# Patient Record
Sex: Female | Born: 1950 | Race: White | Hispanic: No | Marital: Married | Smoking: Never smoker
Health system: Southern US, Community
[De-identification: ages and names within clinical notes are randomized; demographics above are authoritative.]

## PROBLEM LIST (undated history)

## (undated) DIAGNOSIS — H539 Unspecified visual disturbance: Secondary | ICD-10-CM

## (undated) DIAGNOSIS — R519 Headache, unspecified: Secondary | ICD-10-CM

## (undated) DIAGNOSIS — E119 Type 2 diabetes mellitus without complications: Secondary | ICD-10-CM

## (undated) DIAGNOSIS — R51 Headache: Secondary | ICD-10-CM

## (undated) DIAGNOSIS — I10 Essential (primary) hypertension: Secondary | ICD-10-CM

## (undated) HISTORY — PX: VESICOVAGINAL FISTULA CLOSURE W/ TAH: SUR271

## (undated) HISTORY — DX: Essential (primary) hypertension: I10

## (undated) HISTORY — DX: Type 2 diabetes mellitus without complications: E11.9

## (undated) HISTORY — DX: Headache: R51

## (undated) HISTORY — DX: Unspecified visual disturbance: H53.9

## (undated) HISTORY — DX: Headache, unspecified: R51.9

---

## 2015-02-28 ENCOUNTER — Encounter: Payer: Self-pay | Admitting: Neurology

## 2015-02-28 ENCOUNTER — Ambulatory Visit (INDEPENDENT_AMBULATORY_CARE_PROVIDER_SITE_OTHER): Payer: Medicare Other | Admitting: Neurology

## 2015-02-28 VITALS — BP 130/84 | HR 68 | Resp 16 | Ht 62.0 in | Wt 211.6 lb

## 2015-02-28 DIAGNOSIS — G4733 Obstructive sleep apnea (adult) (pediatric): Secondary | ICD-10-CM | POA: Diagnosis not present

## 2015-02-28 DIAGNOSIS — R413 Other amnesia: Secondary | ICD-10-CM

## 2015-02-28 DIAGNOSIS — R2689 Other abnormalities of gait and mobility: Secondary | ICD-10-CM | POA: Diagnosis not present

## 2015-02-28 DIAGNOSIS — M549 Dorsalgia, unspecified: Secondary | ICD-10-CM | POA: Diagnosis not present

## 2015-02-28 DIAGNOSIS — G8929 Other chronic pain: Secondary | ICD-10-CM | POA: Diagnosis not present

## 2015-02-28 DIAGNOSIS — M542 Cervicalgia: Secondary | ICD-10-CM | POA: Diagnosis not present

## 2015-02-28 MED ORDER — DIAZEPAM 10 MG PO TABS: 10.0000 mg | ORAL_TABLET | Freq: Every evening | ORAL | Status: DC | PRN

## 2015-02-28 MED ORDER — LYRICA 200 MG PO CAPS: 200.0000 mg | ORAL_CAPSULE | Freq: Two times a day (BID) | ORAL | Status: DC

## 2015-02-28 NOTE — Progress Notes (Signed)
[facility]  PATIENT: Stacey Jennings DOB: 06-Jun-1951  REFERRING DOCTOR OR PCP:  Forrest Moron SOURCE: Patient and records form [facility]  _________________________________   HISTORICAL  CHIEF COMPLAINT:  Chief Complaint  Patient presents with  . Back Pain    RAS pt. from [facility].  Here for f/u of lbp, right leg numbness.  Hx. of 2 lumbar surgeries by Dr. Nadene Rubins.  Sts. she is currently taking Lyrica and Nucynta--sts. Lyrica doesn't help as much as it used to. Also has hx. of neck pain with one surgery (by Laural Benes NS)--but sts. neck does not bother her./fim    HISTORY OF PRESENT ILLNESS:  Stacey Jennings is a 64 year old woman who I have followed at [facility] for several years. She has multiple neurologic symptoms including poor balance, low back pain, sleep apnea and difficulty with her memory.   Poor balance:    Recently, she feels balance is worse and walking is more difficult.  She feels she tips towards the right and sometimes hits walls or door frames.   She hasn't fallen recently but has a couple falls last year.   Vitamin B12 and copper were normal in December 2015.  Neck pain:   She has some discomfort in her neck but feels that it is stable and tolerable at this point.   MRI of the cervical spine dated 10/04/2014 shows prior fusion at C6-C7 with some posterior bony prominence and left uncovertebral spurring causing mild left foraminal narrowing but no nerve root impingement at this level. A small focus of hyperintensity within the spinal cord adjacent to this level.  At C5-C6, there is mild spinal stenosis due to broad disc protrusion and facet hypertrophy there is mild foraminal narrowing. There is no myelopathic spinal cord changes. Milder degenerative changes are noted at C2-C3 and C4-C5 without impingement of the neural structures.   Low back pain:    MRI dated 02/25/2012 shows prior L4-S1 fusion and a central disc herniation  at L5-S1 displacing both S1 nerve roots, right greater than left with some edema noted in the right S1 nerve root. There is prior surgical fusion at L4-S1. There is severe bilateral facet hypertrophy at L2-L3 and L3-L4.  She had LBP surgery in the past.    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    She has right > left leg pain.   She gets some benefit from Nucynta and Lyrica but the benefit seems less than in the past.   She takes up to 4 Nucynta's a day.   She is on Lyrica 200 mg bid.  She was once on a higher dose but it was lowered when she had confusion in the hospital.   She has had a couple ESI's but the last 2 have not helped as much.    She would prefer not to have surgery again.   Climbing stairs increases her pain.  Legs hurt more when they dangle (like on a stool).   She notes numbness in the right outer foot.      OSA: I reviewed the PSG 01/25/2014 and subsequent titration study.   They show severe OSA with AHI equals 82.3. She was titrated to CPAP +13.  She is compliant but noted no improvement after starting.     Memory:   She remains very forgetful.  She also occasionally 'talks out of her head'.    She tarts talking about a subject that does not make sense  and talk is not in a logical sequence.    She had an MRI years ago but I don't heave records.    She has had TIA in the past and is on Plavix.  She feels boot is doing well and she denies any significant depression or anxiety.  REVIEW OF SYSTEMS: Constitutional: No fevers, chills, sweats, or change in appetite.  Notes fatigue Eyes: No visual changes, double vision, eye pain Ear, nose and throat: No hearing loss, ear pain, nasal congestion, sore throat Cardiovascular: No chest pain, palpitations Respiratory: No shortness of breath at rest or with exertion.   No wheezes GastrointestinaI: No nausea, vomiting, diarrhea, abdominal pain, fecal incontinence Genitourinary: No dysuria, urinary retention or  frequency.  No nocturia. Musculoskeletal: as above Integumentary: No rash, pruritus, skin lesions Neurological: as above Psychiatric: No current depression at this time.  No anxiety Endocrine: No palpitations, diaphoresis, change in appetite, change in weight or increased thirst Hematologic/Lymphatic: No anemia, purpura, petechiae. Allergic/Immunologic: No itchy/runny eyes, nasal congestion, recent allergic reactions, rashes  ALLERGIES: Allergies  Allergen Reactions  . Codeine Other (See Comments)    hypersomnia  . Fentanyl Other (See Comments)    Hypersomnia  . Haldol [Haloperidol Lactate] Other (See Comments)    hallucinations    HOME MEDICATIONS:  Current outpatient prescriptions:  .  amLODipine (NORVASC) 5 MG tablet, Take 5 mg by mouth daily., Disp: , Rfl:  .  clopidogrel (PLAVIX) 75 MG tablet, Take 75 mg by mouth daily., Disp: , Rfl:  .  diazepam (VALIUM) 10 MG tablet, Take 10 mg by mouth at bedtime as needed., Disp: , Rfl:  .  enalapril (VASOTEC) 20 MG tablet, Take 20 mg by mouth 2 (two) times daily., Disp: , Rfl:  .  famotidine (PEPCID) 40 MG tablet, Take 40 mg by mouth daily., Disp: , Rfl:  .  levothyroxine (SYNTHROID, LEVOTHROID) 125 MCG tablet, daily., Disp: , Rfl:  .  LYRICA 200 MG capsule, Take 200 mg by mouth 2 (two) times daily., Disp: , Rfl:  .  metoprolol (TOPROL-XL) 200 MG 24 hr tablet, Take 200 mg by mouth daily., Disp: , Rfl:  .  NUCYNTA 75 MG TABS, Take 75 mg by mouth every 6 (six) hours as needed., Disp: , Rfl:  .  simvastatin (ZOCOR) 10 MG tablet, Take 10 mg by mouth daily., Disp: , Rfl:  .  VICTOZA 18 MG/3ML SOPN, , Disp: , Rfl:   PAST MEDICAL HISTORY: Past Medical History  Diagnosis Date  . Diabetes mellitus without complication   . Headache   . Hypertension   . Vision abnormalities     PAST SURGICAL HISTORY: Past Surgical History  Procedure Laterality Date  . Vesicovaginal fistula closure w/ tah      FAMILY HISTORY: Family History    Problem Relation Age of Onset  . Breast cancer Mother   . Hypertension Mother   . Huntington's disease Father   . Huntington's disease Sister     SOCIAL HISTORY:  History   Social History  . Marital Status: Married    Spouse Name: N/A  . Number of Children: N/A  . Years of Education: N/A   Occupational History  . Not on file.   Social History Main Topics  . Smoking status: Never Smoker   . Smokeless tobacco: Not on file  . Alcohol Use: No  . Drug Use: No  . Sexual Activity: Not on file   Other Topics Concern  . Not on file   Social History  Narrative  . No narrative on file     PHYSICAL EXAM  Filed Vitals:   02/28/15 0841  BP: 130/84  Pulse: 68  Resp: 16  Height:  (1.575 m)  Weight: 211 lb 9.6 oz (95.981 kg)    Body mass index is 38.69 kg/(m^2).   General: The patient is well-developed and well-nourished and in no acute distress  Eyes:  Nonicteric, nonerythematous.  Neck: The neck is supple, no carotid bruits are noted.  The neck is nontender with slight reduced ROM  Cardiovascular: The heart has a regular rate and rhythm with a normal S1 and S2. There were no murmurs, gallops or rubs. Lungs are clear to auscultation.  Skin: Extremities are without significant edema.  Musculoskeletal:  Back shows surgical scar well healed.   Mild paraspinal tenderness  Neurologic Exam  Mental status: The patient is alert and oriented x 3 at the time of the examination. The patient has reduced recent memory (1/3 without prompt, 2/3 category prompt and 3/3 list prompt), with a reduced attention span and concentration ability (100-93-?; WORLD-DLROW).   Clock numbers are well spaced, minute hand is off.   Speech is normal.  Cranial nerves: Extraocular movements are full. Pupils are equal, round, and reactive to light and accomodation.  Visual fields are full.  Facial symmetry is present. There is good facial sensation to soft touch bilaterally.Facial strength is  normal.  Trapezius and sternocleidomastoid strength is normal. No dysarthria is noted.  The tongue is midline, and the patient has symmetric elevation of the soft palate. No obvious hearing deficits are noted.  Motor:  Muscle bulk is normal.   Tone is normal. Strength is  5 / 5 in all 4 extremities.   Sensory: Sensory testing is intact to pinprick, soft touch and vibration sensation in arms and reduced right S1 sensation in legs.  Coordination: Cerebellar testing reveals good finger-nose-finger and heel-to-shin bilaterally.  Gait and station: Station is normal.   Gait is wide stanced with 3 step 180 turn. Tandem gait is poor. Romberg is positive.   Reflexes: Deep tendon reflexes are symmetric and normal bilaterally in arms and absent in both legs.   Plantar responses are flexor.    DIAGNOSTIC DATA (LABS, IMAGING, TESTING) - I reviewed patient records, labs, notes, testing and imaging myself where available.    ASSESSMENT AND PLAN  Poor balance - Plan: MR Brain W Wo Contrast  Neck pain  Chronic back pain  Obstructive sleep apnea  Memory loss - Plan: MR Brain W Wo Contrast   In summary, Stacey Jennings is a 64 year old woman who has noted more difficulty with her balance and memory over the past year. At her last visit with me, I checked vitamin B12 and copper and they were fine. 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 I will check an MRI of the brain to rule out SDH, NPH, strokes and mass lesions. She should continue her CPAP for OSA. Unfortunately, starting CPAP did not help her cognition.   Her other issues are related to pain in the neck and the back and legs.    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   I will refill her medications and she is advised to try to stay active.  She will return to see me in 3 months or  sooner if she has  new or worsening neurologic symptoms.  50 minute face-to-face interaction with greater than 50% of the time counseling or coordinating care about her worsening balance and memory.    Telisa Ohlsen A. Epimenio Foot, MD, PhD 02/28/2015, 9:04 AM Certified in Neurology, Clinical Neurophysiology, Sleep Medicine, Pain Medicine and Neuroimaging  [facility] [address] [place], [place] [address] [phone]

## 2015-03-14 ENCOUNTER — Ambulatory Visit
Admission: RE | Admit: 2015-03-14 | Discharge: 2015-03-14 | Disposition: A | Discharge status: Home / Self Care | Payer: Medicare Other | Source: Ambulatory Visit | Attending: Neurology | Admitting: Neurology

## 2015-03-14 DIAGNOSIS — R2689 Other abnormalities of gait and mobility: Secondary | ICD-10-CM

## 2015-03-14 DIAGNOSIS — R413 Other amnesia: Secondary | ICD-10-CM

## 2015-03-14 MED ORDER — GADOBENATE DIMEGLUMINE 529 MG/ML IV SOLN
20.0000 mL | Freq: Once | INTRAVENOUS | Status: AC | PRN
  Administered 2015-03-14: 20 mL via INTRAVENOUS

## 2015-03-20 ENCOUNTER — Telehealth: Payer: Self-pay | Admitting: *Deleted

## 2015-03-20 NOTE — Telephone Encounter (Signed)
Patient called/retuirning Faith's call from today. Please call and advise. Patient can be reached @ [phone]

## 2015-03-20 NOTE — Telephone Encounter (Signed)
-----   Message from Asa Lenteichard A Sater, MD sent at 03/20/2015  2:16 PM EDT ----- MRI shows old ischemic changes, noting looks recent...     I can compare with old MRI (I only have spine MR's form [facility], please request last Brain... [facility], [facility])

## 2015-03-20 NOTE — Telephone Encounter (Signed)
LMTC./fim 

## 2015-03-21 NOTE — Telephone Encounter (Signed)
-----   Message from Richard A Sater, MD sent at 03/20/2015  2:16 PM EDT ----- MRI shows old ischemic changes, noting looks recent...     I can compare with old MRI (I only have spine MR's form [facility], please request last Brain... If no recent one at [facility], please check [facility]) 

## 2015-03-21 NOTE — Telephone Encounter (Signed)
Patient returned call again. Please call and advise.

## 2015-03-21 NOTE — Telephone Encounter (Signed)
I have spoken with Stacey Jennings today and advised that per RAS, the mri of her brain showed old changes, but nothing that looks new.  I have checked with [facility] and was told that she has not had any mri brain there.  Stacey Jennings [facility].  She will pick up a copy on cd and drop it by the office./fim

## 2015-04-11 ENCOUNTER — Ambulatory Visit: Payer: Medicare Other | Admitting: Neurology

## 2015-04-19 ENCOUNTER — Encounter: Payer: Self-pay | Admitting: Neurology

## 2015-05-23 ENCOUNTER — Encounter: Payer: Self-pay | Admitting: Neurology

## 2015-05-23 ENCOUNTER — Ambulatory Visit (INDEPENDENT_AMBULATORY_CARE_PROVIDER_SITE_OTHER): Payer: Medicare Other | Admitting: Neurology

## 2015-05-23 VITALS — BP 156/72 | HR 68 | Resp 14 | Ht 62.0 in | Wt 192.0 lb

## 2015-05-23 DIAGNOSIS — G4733 Obstructive sleep apnea (adult) (pediatric): Secondary | ICD-10-CM

## 2015-05-23 DIAGNOSIS — I639 Cerebral infarction, unspecified: Secondary | ICD-10-CM | POA: Diagnosis not present

## 2015-05-23 DIAGNOSIS — M542 Cervicalgia: Secondary | ICD-10-CM | POA: Diagnosis not present

## 2015-05-23 DIAGNOSIS — Q211 Atrial septal defect, unspecified: Secondary | ICD-10-CM | POA: Insufficient documentation

## 2015-05-23 DIAGNOSIS — R2689 Other abnormalities of gait and mobility: Secondary | ICD-10-CM | POA: Diagnosis not present

## 2015-05-23 DIAGNOSIS — M549 Dorsalgia, unspecified: Secondary | ICD-10-CM | POA: Diagnosis not present

## 2015-05-23 DIAGNOSIS — R4189 Other symptoms and signs involving cognitive functions and awareness: Secondary | ICD-10-CM

## 2015-05-23 DIAGNOSIS — G8929 Other chronic pain: Secondary | ICD-10-CM | POA: Diagnosis not present

## 2015-05-23 NOTE — Progress Notes (Signed)
[facility]  PATIENT: Stacey Jennings DOB: 04/06/1951  REFERRING DOCTOR OR PCP:  Forrest MoronStephen Ruehle SOURCE: Patient and records form [facility]  _________________________________   HISTORICAL  CHIEF COMPLAINT:  Chief Complaint  Patient presents with  . Cerebrovascular Accident    Sts. in June she was in [place] with her family,  had sudden decline in cognition.  [facility]--husband sts. 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  She sts. she has an appt. with Dr. Bary CastillaKalil next week to discuss repairing ASD/fim    HISTORY OF PRESENT ILLNESS:  Stacey Jennings is a 64 year old woman who I have [facility] years. She has multiple neurologic symptoms including poor balance, low back pain, sleep apnea and difficulty with her memory.   While in [place] last month she had mental status changes and confusion.   Family thought she may have been dehydrated and they went to an Urgent Care.   The next day, she slept in late and when she tried to get out of bed, she slid off bed and was unable to get up for a couple hours.   She then became confused a little later and seemed to have some hallucinations.  She went to the hospital.   She was told she had a stroke a couple weeks earlier and they felt it may have been cardioembolic.   A TEE was done apparently showing an ASD.      Memory:   She ha shad been forgetful for the past couple years . She feels mood is doing well and she denies any significant depression or anxiety.   Family has noted that she has had some fluctuation is cognitive functioning, some days she seems mildy confused, although not the way she was last month.      Poor balance:    This is a little variable but no worse than last year.   She stumbles at times.   Vitamin B12 and copper were normal in December 2015.  OSA: She has severe OSA with AHI equals 82.3. She  was titrated to CPAP +13.  She is compliant.     She is urinating less at night but has not noted any other improvement.     Neck pain and back pain are doing better.   She notes benefit from Nucynta and Lyrica. MRI of the cervical spine dated 10/04/2014 shows prior fusion at C6-C7 with some posterior bony prominence and left uncovertebral spurring causing mild left foraminal narrowing but no nerve root impingement at this level. A small focus of hyperintensity within the spinal cord adjacent to this level.  At C5-C6, there is mild spinal stenosis due to broad disc protrusion and facet hypertrophy there is mild foraminal narrowing. There is no myelopathic spinal cord changes.   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 There is prior surgical fusion at L4-S1. There is severe bilateral facet hypertrophy at L2-L3 and L3-L4.     REVIEW OF SYSTEMS: Constitutional: No fevers, chills, sweats, or change in appetite.  Notes fatigue Eyes: No visual changes, double vision, eye pain Ear, nose and throat: No hearing loss, ear pain, nasal congestion, sore throat Cardiovascular: No chest pain, palpitations Respiratory: No shortness of breath at rest or with exertion.  No wheezes GastrointestinaI: No nausea, vomiting, diarrhea, abdominal pain, fecal incontinence Genitourinary: No dysuria, urinary retention or frequency.  No nocturia. Musculoskeletal: as above Integumentary: No rash, pruritus, skin lesions Neurological: as above Psychiatric: No current depression at this time.  No anxiety                                                                                                                                                                                                                                                                                                                                                                                                                                                         Endocrine: No palpitations, diaphoresis, change in appetite, change in weight or increased thirst Hematologic/Lymphatic: No anemia, purpura, petechiae. Allergic/Immunologic: No itchy/runny eyes, nasal congestion, recent allergic reactions, rashes  ALLERGIES: Allergies  Allergen Reactions  . Codeine Other (See Comments)    hypersomnia  . Fentanyl Other (See Comments)    Hypersomnia  . Haldol [Haloperidol Lactate] Other (See Comments)    hallucinations    HOME MEDICATIONS:  Current outpatient prescriptions:  .  amLODipine (NORVASC) 5 MG tablet, Take 5 mg by mouth daily., Disp: , Rfl:  .  clopidogrel (PLAVIX) 75 MG tablet, Take 75 mg by mouth daily., Disp: , Rfl:  .  diazepam (VALIUM) 10 MG tablet,  Take 1 tablet (10 mg total) by mouth at bedtime as needed., Disp: 90 tablet, Rfl: 1 .  enalapril (VASOTEC) 20 MG tablet, Take 20 mg by mouth 2 (two) times daily., Disp: , Rfl:  .  famotidine (PEPCID) 40 MG tablet, Take 40 mg by mouth daily., Disp: , Rfl:  .  levothyroxine (SYNTHROID, LEVOTHROID) 125 MCG tablet, daily., Disp: , Rfl:  .  LYRICA 200 MG capsule, Take 1 capsule (200 mg total) by mouth 2 (two) times daily., Disp: 180 capsule, Rfl: 1 .  metoprolol (TOPROL-XL) 200 MG 24 hr tablet, Take 200 mg by mouth daily., Disp: , Rfl:  .  NUCYNTA 75 MG TABS, Take 75 mg by mouth every 6 (six) hours as needed., Disp: , Rfl:  .  simvastatin (ZOCOR) 10 MG tablet, Take 10 mg by mouth daily., Disp: , Rfl:  .  VICTOZA 18 MG/3ML SOPN, , Disp: , Rfl:   PAST MEDICAL HISTORY: Past Medical History  Diagnosis Date  . Diabetes mellitus without complication   . Headache   . Hypertension   . Vision abnormalities     PAST SURGICAL HISTORY: Past Surgical History  Procedure Laterality Date  . Vesicovaginal fistula closure w/ tah      FAMILY  HISTORY: Family History  Problem Relation Age of Onset  . Breast cancer Mother   . Hypertension Mother   . Huntington's disease Father   . Huntington's disease Sister     SOCIAL HISTORY:  History   Social History  . Marital Status: Married    Spouse Name: N/A  . Number of Children: N/A  . Years of Education: N/A   Occupational History  . Not on file.   Social History Main Topics  . Smoking status: Never Smoker   . Smokeless tobacco: Not on file  . Alcohol Use: No  . Drug Use: No  . Sexual Activity: Not on file   Other Topics Concern  . Not on file   Social History Narrative     PHYSICAL EXAM  Filed Vitals:   05/23/15 1312  BP: 156/72  Pulse: 68  Resp: 14  Height: 5\' 2"  (1.575 m)  Weight: 192 lb (87.091 kg)    Body mass index is 35.11 kg/(m^2).   General: The patient is well-developed and well-nourished and in no acute distress  Eyes:  Nonicteric, nonerythematous.  Neck: The neck is supple, no carotid bruits are noted.  The neck is nontender with slight reduced ROM  Cardiovascular: The heart has a regular rate and rhythm with a normal S1 and S2. There were no murmurs, gallops or rubs. Lungs are clear to auscultation.  Skin: Extremities are without significant edema.  Musculoskeletal:  Back shows surgical scar well healed.   Mild paraspinal tenderness  Neurologic Exam  Mental status: The patient is alert and oriented x 3 at the time of the examination. The patient has reduced recent memory (1/3 without prompt, 2/3 category prompt and 3/3 list prompt), with a reduced attention span and concentration ability .       Speech is normal.  Cranial nerves: Extraocular movements are full. Pupils are equal, round, and reactive to light and accomodation.  Visual fields are full.  No neglect or extinction.   Facial symmetry is present. There is good facial sensation to soft touch bilaterally.Facial strength is normal.  Trapezius and sternocleidomastoid strength is  normal. No dysarthria is noted.  The tongue is midline, and the patient has symmetric elevation of the soft palate. No  obvious hearing deficits are noted.  Motor:  Muscle bulk is normal.   Tone is normal. Strength is  5 / 5 in all 4 extremities.   Sensory: Sensory testing is intact to pinprick, soft touch and vibration sensation in arms and reduced right S1 sensation in legs.  Coordination: Cerebellar testing reveals good finger-nose-finger and heel-to-shin bilaterally.  Gait and station: Station is normal.   Gait is mildly wide stanced  And she has a 3 step 180 turn. Tandem gait is poor. Romberg is positive.   Reflexes: Deep tendon reflexes are symmetric and normal bilaterally in arms and absent in both legs.     DIAGNOSTIC DATA (LABS, IMAGING, TESTING) - I reviewed patient records, labs, notes, testing and imaging myself where available.    ASSESSMENT AND PLAN  No diagnosis found. Cardioembolic stroke  ASD (atrial septal defect)  Obstructive sleep apnea  Poor balance  Disturbed cognition  Chronic back pain  Neck pain    1.   We will get records form [facility] in [place].    2.   Continue Plavix 3.   Long discussion about strokes and possible relationship to the ASD. It is important that I get her actual images from [place] to compare with her older MRIs. 4.   She will return to see me in 3 months or sooner if she has new or worsening neurologic symptoms.  45 minute face-to-face interaction with greater than one half of the time counseling and coordinating care about her recent stroke.   Serafino Burciaga A. Epimenio Foot, MD, PhD 05/23/2015, 1:16 PM Certified in Neurology, Clinical Neurophysiology, Sleep Medicine, Pain Medicine and Neuroimaging  [facility] [address] [place], [place] [address] [phone]

## 2015-05-30 DIAGNOSIS — I10 Essential (primary) hypertension: Secondary | ICD-10-CM | POA: Insufficient documentation

## 2015-05-30 DIAGNOSIS — E119 Type 2 diabetes mellitus without complications: Secondary | ICD-10-CM | POA: Insufficient documentation

## 2015-05-30 DIAGNOSIS — E785 Hyperlipidemia, unspecified: Secondary | ICD-10-CM | POA: Insufficient documentation

## 2015-05-30 DIAGNOSIS — E039 Hypothyroidism, unspecified: Secondary | ICD-10-CM | POA: Insufficient documentation

## 2015-06-06 DIAGNOSIS — Q211 Atrial septal defect: Secondary | ICD-10-CM | POA: Insufficient documentation

## 2015-06-06 DIAGNOSIS — Q2112 Patent foramen ovale: Secondary | ICD-10-CM | POA: Insufficient documentation

## 2015-06-06 DIAGNOSIS — G454 Transient global amnesia: Secondary | ICD-10-CM | POA: Insufficient documentation

## 2015-07-04 ENCOUNTER — Ambulatory Visit: Payer: Medicare Other | Admitting: Neurology

## 2015-07-04 ENCOUNTER — Encounter: Payer: Self-pay | Admitting: Neurology

## 2015-07-31 ENCOUNTER — Telehealth: Payer: Self-pay | Admitting: Neurology

## 2015-07-31 NOTE — Telephone Encounter (Signed)
Pt needs rx on NUCYNTA 75 MG TABS for a 90 day supply. May call pt at [phone]

## 2015-08-03 ENCOUNTER — Other Ambulatory Visit: Payer: Self-pay | Admitting: *Deleted

## 2015-08-03 MED ORDER — NUCYNTA 75 MG PO TABS: ORAL_TABLET | ORAL | Status: DC

## 2015-08-03 NOTE — Telephone Encounter (Signed)
Husband presented to the office for Nucynta r/f.  He apparently called on Monday, and message was taken by phone room, but never routed to anyone. Rx. printed, signed, up front [facility]/fim

## 2015-08-23 ENCOUNTER — Ambulatory Visit: Payer: Medicare Other | Admitting: Neurology

## 2015-08-29 ENCOUNTER — Ambulatory Visit (INDEPENDENT_AMBULATORY_CARE_PROVIDER_SITE_OTHER): Payer: Medicare Other | Admitting: Neurology

## 2015-08-29 ENCOUNTER — Encounter: Payer: Self-pay | Admitting: Neurology

## 2015-08-29 VITALS — BP 108/64 | HR 78 | Resp 14 | Ht 62.0 in | Wt 189.4 lb

## 2015-08-29 DIAGNOSIS — I639 Cerebral infarction, unspecified: Secondary | ICD-10-CM

## 2015-08-29 DIAGNOSIS — M546 Pain in thoracic spine: Secondary | ICD-10-CM | POA: Insufficient documentation

## 2015-08-29 DIAGNOSIS — G4733 Obstructive sleep apnea (adult) (pediatric): Secondary | ICD-10-CM | POA: Diagnosis not present

## 2015-08-29 DIAGNOSIS — M549 Dorsalgia, unspecified: Secondary | ICD-10-CM | POA: Diagnosis not present

## 2015-08-29 DIAGNOSIS — G8929 Other chronic pain: Secondary | ICD-10-CM

## 2015-08-29 DIAGNOSIS — Q211 Atrial septal defect, unspecified: Secondary | ICD-10-CM

## 2015-08-29 DIAGNOSIS — S22009A Unspecified fracture of unspecified thoracic vertebra, initial encounter for closed fracture: Secondary | ICD-10-CM | POA: Insufficient documentation

## 2015-08-29 DIAGNOSIS — S22000A Wedge compression fracture of unspecified thoracic vertebra, initial encounter for closed fracture: Secondary | ICD-10-CM | POA: Diagnosis not present

## 2015-08-29 MED ORDER — LYRICA 200 MG PO CAPS: 200.0000 mg | ORAL_CAPSULE | Freq: Two times a day (BID) | ORAL | Status: DC

## 2015-08-29 MED ORDER — NUCYNTA 75 MG PO TABS: ORAL_TABLET | ORAL | Status: DC

## 2015-08-29 NOTE — Progress Notes (Signed)
[facility]  PATIENT: Stacey Jennings DOB: May 12, 1951  REFERRING DOCTOR OR PCP:  Forrest Moron SOURCE: Patient and records form [facility]  _________________________________   HISTORICAL  CHIEF COMPLAINT:  Chief Complaint  Patient presents with  . 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 she has seen Dr. Bary Castilla to discuss ASD, tx. plan.  Sts. 2 wk. holter monitor was ok.  Sts. Dr. Bary Castilla rec. Aspirin 81mg  daily, which she is taking, along with Plavix, but that he did not strongly rec. fixing ASD at this time.  She sts. she will begin exercising as well (walking).  Sts. memory is about the same./fim  . Memory Loss  . Back Pain    Sts. has had right sided mid back pain since a fall in June.  Today she has cd images of x-rays of her right ribs and t-spine--sts. pcp ordered, told her she has a compression fx/fim    HISTORY OF PRESENT ILLNESS:  Stacey Jennings is a 64 year old woman wit more mid back pain, recent h/o cardio-embolic stroke, reduced memory, OSA and more chronic pain issues.     Mid back pain:   She has had more back pain since a fall in June.  Pain increased further last month.  She gets seconds of severe pain multiple times a day.  She has no pain when sitting and not moving.     Xray of T spine shows T7 fracture 08/09/15.  She has not had an MRI of the spine      CVA:   While in [place] last month she had mental status changes and confusion.   Family thought she may have been dehydrated and they went to an Urgent Care.   The next day, she slept in late and when she tried to get out of bed, she slid off bed and was unable to get up for a couple hours.   She then became confused a little later and seemed to have some hallucinations.  She went to the hospital.   She was told she had a stroke a couple weeks earlier and they felt it may have been cardioembolic.   A TEE was done apparently showing an ASD.      Memory:   She feels more  forgetful the past few years.   . She feels mood is doing well and she denies any significant depression or anxiety.   Family has noted that she has had some fluctuation is cognitive functioning, some days she seems mildy confused, although not the way she was last month.      Poor balance:   She notes gait is poor many days and better other days.    Vitamin B12 and copper were normal in December 2015.  OSA: She has severe OSA with AHI equals 82.3. She was titrated to CPAP +13.  She is compliant.     She is urinating less at night but has not noted any other improvement.     Neck pain and back pain are doing better.   She notes benefit from Nucynta 75 mg tid and Lyrica bid   Studies:   MRI of the cervical spine dated 10/04/2014 shows prior fusion at C6-C7 with some posterior bony prominence and left uncovertebral spurring causing mild left foraminal narrowing but no nerve root impingement at this level. A small focus of hyperintensity within the spinal cord adjacent to this level.  At C5-C6, there is mild spinal stenosis due to broad  disc protrusion and facet hypertrophy there is mild foraminal narrowing. There is no myelopathic spinal cord changes. 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 There is prior surgical fusion at L4-S1. There is severe bilateral facet hypertrophy at L2-L3 and L3-L4.     REVIEW OF SYSTEMS: Constitutional: No fevers, chills, sweats, or change in appetite.  Notes fatigue Eyes: No visual changes, double vision, eye pain Ear, nose and throat: No hearing loss, ear pain, nasal congestion, sore throat Cardiovascular: No chest pain, palpitations Respiratory: No shortness of breath at rest or with exertion.   No wheezes GastrointestinaI: No nausea, vomiting, diarrhea, abdominal pain, fecal incontinence Genitourinary: No  dysuria, urinary retention or frequency.  No nocturia. Musculoskeletal: as above Integumentary: No rash, pruritus, skin lesions Neurological: as above Psychiatric: No current depression at this time.  No anxiety  Endocrine: No palpitations, diaphoresis, change in appetite, change in weight or increased thirst Hematologic/Lymphatic: No anemia, purpura, petechiae. Allergic/Immunologic: No itchy/runny eyes, nasal congestion, recent allergic reactions, rashes  ALLERGIES: Allergies  Allergen Reactions  . Codeine Other (See Comments)    hypersomnia  . Fentanyl Other (See Comments)    Hypersomnia  . Haldol [Haloperidol Lactate] Other (See Comments)    hallucinations    HOME MEDICATIONS:  Current outpatient prescriptions:  .  amLODipine (NORVASC) 5 MG tablet, Take 5 mg by mouth daily., Disp: , Rfl:  .  aspirin EC 81 MG tablet, Take 81 mg by mouth daily., Disp: , Rfl:  .  clopidogrel (PLAVIX) 75 MG tablet, Take 75 mg by mouth daily., Disp: , Rfl:  .  diazepam (VALIUM) 10 MG tablet, Take 1 tablet (10 mg total) by mouth at bedtime as needed., Disp: 90 tablet, Rfl: 1 .  enalapril (VASOTEC) 20 MG tablet, Take 20 mg by mouth 2 (two) times daily., Disp: , Rfl:  .  famotidine (PEPCID) 40 MG tablet, Take 40 mg by mouth daily., Disp: , Rfl:  .  levothyroxine (SYNTHROID, LEVOTHROID) 125 MCG tablet, daily., Disp: , Rfl:  .  LYRICA 200 MG capsule, Take 1 capsule (200 mg total) by mouth 2 (two) times daily., Disp: 180 capsule, Rfl: 1 .  metFORMIN (GLUCOPHAGE) 500 MG tablet,  Take 500 mg by mouth daily., Disp: , Rfl:  .  metoprolol (TOPROL-XL) 200 MG 24 hr tablet, Take 200 mg by mouth daily., Disp: , Rfl:  .  NUCYNTA 75 MG TABS, Take one tablet 4 times daily as needed for pain.  Pharmacy may dispense one 30 day supply with 2 additional refills, or one 90 day supply., Disp: 360 tablet, Rfl: 0 .  simvastatin (ZOCOR) 10 MG tablet, Take 10 mg by mouth daily., Disp: , Rfl:  .  VICTOZA 18 MG/3ML SOPN, , Disp: , Rfl:   PAST MEDICAL HISTORY: Past Medical History  Diagnosis Date  . Diabetes mellitus without complication (HCC)   . Headache   . Hypertension   . Vision abnormalities     PAST SURGICAL HISTORY: Past Surgical History  Procedure Laterality Date  . Vesicovaginal fistula closure w/ tah      FAMILY HISTORY: Family History  Problem Relation Age of Onset  . Breast cancer Mother   . Hypertension Mother   . Huntington's disease Father   . Huntington's disease Sister     SOCIAL HISTORY:  Social History   Social History  . Marital Status: Married    Spouse Name: N/A  . Number of Children: N/A  . Years of Education: N/A   Occupational History  . Not on file.   Social History Main Topics  . Smoking status: Never Smoker   . Smokeless tobacco: Not on file  . Alcohol Use: No  . Drug Use: No  . Sexual Activity: Not on file   Other Topics Concern  . Not on file   Social History Narrative     PHYSICAL EXAM  Filed Vitals:   08/29/15 1419  BP: 108/64  Pulse: 78  Resp: 14  Height:  (1.575 m)  Weight: 189 lb 6.4 oz (85.911 kg)    Body mass index is 34.63 kg/(m^2).   General: The patient is well-developed and well-nourished and in no acute distress   Neurologic Exam  Mental status: The patient is alert and oriented x 3 at the time of the examination. The patient has reduced recent memory ( 2/3  and 3/3 list prompt), with a reduced attention span and concentration ability .     Speech is normal.  Cranial nerves: Extraocular  movements are full.  No neglect or extinction.   Facial symmetry is present. There is good facial sensation to soft touch bilaterally.Facial strength is normal.  Trapezius and sternocleidomastoid strength is normal. No dysarthria is noted. No obvious hearing deficits are noted.  Motor:  Muscle bulk is normal.   Tone is normal. Strength is  5 / 5 in all 4 extremities.   Sensory: Sensory testing is intact to pinprick, soft touch and vibration sensation in arms and reduced right S1 sensation in legs.  Coordination: Cerebellar testing reveals good finger-nose-finger and heel-to-shin bilaterally.  Gait and station: Station is normal.   Gait is mildly wide stanced with a 3 step 180 turn. Tandem gait is poor. Romberg is positive.   Reflexes: Deep tendon reflexes are symmetric and normal bilaterally in arms and absent in both legs.     DIAGNOSTIC DATA (LABS, IMAGING, TESTING) - I reviewed patient records, labs, notes, testing and imaging myself where available.    ASSESSMENT AND PLAN  Chronic back pain  Cardioembolic stroke (HCC)  ASD (atrial septal defect)  Obstructive sleep apnea Chronic back pain  Cardioembolic stroke (HCC)  ASD (atrial septal defect)  Obstructive sleep apnea    1.   D[facility] [place].    2.   Continue Plavix and ASA 3.   Renew Lyrica and Nucynta 4.    rtc 4 months or sooner if there are new or worsening neurologic symptoms. Richard A. Epimenio FootSater, MD, PhD 08/29/2015, 2:54 PM Certified in Neurology, Clinical Neurophysiology, Sleep Medicine, Pain Medicine and Neuroimaging  [facility] [address], [address] [place], [place] [id number] [phone]

## 2015-09-19 ENCOUNTER — Telehealth: Payer: Self-pay | Admitting: Neurology

## 2015-09-19 NOTE — Telephone Encounter (Signed)
Pt called and would like to know if she needs to come in to discuss her MRI results. She says that she can only come in on any Tuesday or Wednesdays, those are the only days she has a driver. Please call and advise [phone]

## 2015-09-20 NOTE — Telephone Encounter (Signed)
MRI of the thoracic spine shows that the mild thoracic fracture at T7 is healed at this point. Therefore there would be no benefit of doing vertebroplasty or other procedure.   There is no nerve root compression noted  If she is still having pain we can recommend physical therapy and that she continue her medications.

## 2015-09-20 NOTE — Telephone Encounter (Signed)
I have spoken with Stacey Jennings this morning and per RAS, advised that he has reviewed her mri t-spine, that fx. is completely healed, that she does not need to return to see him for this.  She verbalized understanding of same/fim

## 2015-11-15 ENCOUNTER — Other Ambulatory Visit: Payer: Self-pay | Admitting: Neurology

## 2015-11-15 ENCOUNTER — Encounter: Payer: Self-pay | Admitting: *Deleted

## 2015-11-15 MED ORDER — DIAZEPAM 10 MG PO TABS: 10.0000 mg | ORAL_TABLET | Freq: Every evening | ORAL | Status: DC | PRN

## 2015-11-15 NOTE — Progress Notes (Signed)
Valium rx. up front [facility]/fim 

## 2015-11-15 NOTE — Telephone Encounter (Signed)
Pt called requesting RX for diazepam (VALIUM) 10 MG tablet . Pt advised RX will ready within 24 hrs unless otherwise informed by RN

## 2015-12-06 DIAGNOSIS — Z9181 History of falling: Secondary | ICD-10-CM | POA: Insufficient documentation

## 2015-12-29 ENCOUNTER — Telehealth: Payer: Self-pay | Admitting: *Deleted

## 2016-01-01 NOTE — Telephone Encounter (Signed)
I have spoken with Stacey Jennings this morning and advised that I do not recall getting mri's from [place].  I have checked with RAS and he doesn't recall getting them either/fim

## 2016-01-02 ENCOUNTER — Ambulatory Visit (INDEPENDENT_AMBULATORY_CARE_PROVIDER_SITE_OTHER): Payer: Medicare Other | Admitting: Neurology

## 2016-01-02 ENCOUNTER — Encounter: Payer: Self-pay | Admitting: Neurology

## 2016-01-02 VITALS — BP 122/64 | HR 74 | Resp 18 | Ht 62.0 in | Wt 211.2 lb

## 2016-01-02 DIAGNOSIS — M549 Dorsalgia, unspecified: Secondary | ICD-10-CM

## 2016-01-02 DIAGNOSIS — I639 Cerebral infarction, unspecified: Secondary | ICD-10-CM

## 2016-01-02 DIAGNOSIS — Q211 Atrial septal defect: Secondary | ICD-10-CM | POA: Diagnosis not present

## 2016-01-02 DIAGNOSIS — S22000D Wedge compression fracture of unspecified thoracic vertebra, subsequent encounter for fracture with routine healing: Secondary | ICD-10-CM | POA: Diagnosis not present

## 2016-01-02 DIAGNOSIS — R0781 Pleurodynia: Secondary | ICD-10-CM

## 2016-01-02 DIAGNOSIS — G4733 Obstructive sleep apnea (adult) (pediatric): Secondary | ICD-10-CM

## 2016-01-02 DIAGNOSIS — R4189 Other symptoms and signs involving cognitive functions and awareness: Secondary | ICD-10-CM

## 2016-01-02 DIAGNOSIS — M542 Cervicalgia: Secondary | ICD-10-CM | POA: Diagnosis not present

## 2016-01-02 DIAGNOSIS — R0789 Other chest pain: Secondary | ICD-10-CM

## 2016-01-02 DIAGNOSIS — G8929 Other chronic pain: Secondary | ICD-10-CM | POA: Diagnosis not present

## 2016-01-02 DIAGNOSIS — Q2112 Patent foramen ovale: Secondary | ICD-10-CM

## 2016-01-02 MED ORDER — LYRICA 200 MG PO CAPS
200.0000 mg | ORAL_CAPSULE | Freq: Two times a day (BID) | ORAL | Status: DC
Start: 2016-01-02 — End: 2016-05-01

## 2016-01-02 MED ORDER — DULOXETINE HCL 60 MG PO CPEP: 60.0000 mg | ORAL_CAPSULE | Freq: Every day | ORAL | Status: DC

## 2016-01-02 MED ORDER — NUCYNTA 75 MG PO TABS: ORAL_TABLET | ORAL | Status: DC

## 2016-01-02 NOTE — Progress Notes (Signed)
[facility]  PATIENT: Stacey Jennings DOB: 1951/10/13  REFERRING DOCTOR OR PCP:  Forrest Moron SOURCE: Patient and records form [facility]  _________________________________   HISTORICAL  CHIEF COMPLAINT:  Chief Complaint  Patient presents with  . 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 she continues to take Plavix and ASA as rx'd.  Sts. she fell a couple of weeks ago--had been sitting on the toilet and when she got up, her leg was numb--she fell ? hitting right mid-upper back, right lateral rib area on the toilet./fim  . Back Pain    HISTORY OF PRESENT ILLNESS:  Stacey Jennings is a 65 year old woman with recent h/o cardio-embolic stroke, reduced memory, OSA and more chronic pain issues.       Recently, she had a fall when her left leg gave out upon standing.    She hit her right flank on the toilet and has had more mid/lower back pain.   She feels pain is similar to pain when she had a T7 fracture (around June 2016).    She notes gait is poor many days and better other days.    Vitamin B12 and copper were normal in December 2015.   Her legs often have a tingling dysesthesia.    CVA:   She has had no other stroke like symptoms.    Last  May, while in [place] last month she had mental status changes and confusion.   Family thought she may have been dehydrated and they went to an Urgent Care.   The next day, she slept in late and when she tried to get out of bed, she slid off bed and was unable to get up for a couple hours.   She then became confused a little later and seemed to have some hallucinations.  She went to the hospital.   She was told she had a stroke a couple weeks earlier and they felt it may have been cardioembolic.   A TEE was done apparently showing an ASD.      Memory/mood:   She feels forgetful the past few years, but feels unchanged today.  She takes longer to come up with the right thought.       She has had depression or  anxiety. She has not been on an antidepressant recently.     Family has noted that she has had some fluctuation is cognitive functioning.      OSA: She has severe OSA with AHI equals 82.3. She was titrated to CPAP +13.  She is compliant.     She is urinating less at night but has not noted any other improvement.     Neck pain and lower back pain are doing better.   She notes benefit from Nucynta 75 mg tid and Lyrica bid   Studies:   MRI of the cervical spine dated 10/04/2014 shows prior fusion at C6-C7 with some posterior bony prominence and left uncovertebral spurring causing mild left foraminal narrowing but no nerve root impingement at this level. A small focus of hyperintensity within the spinal cord adjacent to this level.  At C5-C6, there is mild spinal stenosis due to broad disc protrusion and facet hypertrophy there is mild foraminal narrowing. There is no myelopathic spinal cord changes. 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 There is  prior surgical fusion at L4-S1. There is severe bilateral facet hypertrophy at L2-L3 and L3-L4.   MRI of the brain 03/15/2015, before her stroke, showed 2 small lacunar infarctions in the left frontal lobe and some scattered chronic microvascular ischemic changes.    REVIEW OF SYSTEMS: Constitutional: No fevers, chills, sweats, or change in appetite.  Notes fatigue Eyes: No visual changes, double vision, eye pain Ear, nose and throat: No hearing loss, ear pain, nasal congestion, sore throat Cardiovascular: No chest pain, palpitations Respiratory: No shortness of breath at rest or with exertion.   No wheezes GastrointestinaI: No nausea, vomiting, diarrhea, abdominal pain, fecal incontinence Genitourinary: No dysuria, urinary retention or frequency.  No nocturia. Musculoskeletal: as  above Integumentary: No rash, pruritus, skin lesions Neurological: as above Psychiatric: No current depression at this time.  No anxiety                                                                                                                                                                                                                                                                                                                                                                                                                                                        Endocrine: No palpitations, diaphoresis, change in appetite, change in weight or increased thirst Hematologic/Lymphatic: No anemia, purpura, petechiae. Allergic/Immunologic:  No itchy/runny eyes, nasal congestion, recent allergic reactions, rashes  ALLERGIES: Allergies  Allergen Reactions  . Codeine Other (See Comments)    hypersomnia  . Cyclobenzaprine     Other reaction(s): Other (See Comments)  . Fentanyl Other (See Comments)    Hypersomnia  . Haldol [Haloperidol Lactate] Other (See Comments)    hallucinations    HOME MEDICATIONS:  Current outpatient prescriptions:  .  amLODipine (NORVASC) 5 MG tablet, Take 5 mg by mouth daily., Disp: , Rfl:  .  aspirin EC 81 MG tablet, Take 81 mg by mouth daily., Disp: , Rfl:  .  clopidogrel (PLAVIX) 75 MG tablet, Take 75 mg by mouth daily., Disp: , Rfl:  .  enalapril (VASOTEC) 20 MG tablet, Take 20 mg by mouth 2 (two) times daily., Disp: , Rfl:  .  famotidine (PEPCID) 40 MG tablet, Take 40 mg by mouth daily., Disp: , Rfl:  .  levothyroxine (SYNTHROID, LEVOTHROID) 125 MCG tablet, daily., Disp: , Rfl:  .  LYRICA 200 MG capsule, Take 1 capsule (200 mg total) by mouth 2 (two) times daily., Disp: 180 capsule, Rfl: 1 .  metFORMIN (GLUCOPHAGE) 500 MG tablet, Take 500 mg by mouth daily., Disp: , Rfl:  .  metoprolol (TOPROL-XL) 200 MG 24 hr tablet, Take 200 mg by mouth daily., Disp: , Rfl:   .  NUCYNTA 75 MG TABS, Take one tablet 4 times daily as needed for pain.  Pharmacy may dispense one 30 day supply with 2 additional refills, or one 90 day supply., Disp: 360 tablet, Rfl: 0 .  simvastatin (ZOCOR) 10 MG tablet, Take 10 mg by mouth daily., Disp: , Rfl:  .  VICTOZA 18 MG/3ML SOPN, , Disp: , Rfl:  .  diazepam (VALIUM) 10 MG tablet, Take 1 tablet (10 mg total) by mouth at bedtime as needed. (Patient not taking: Reported on 01/02/2016), Disp: 90 tablet, Rfl: 1  PAST MEDICAL HISTORY: Past Medical History  Diagnosis Date  . Diabetes mellitus without complication (HCC)   . Headache   . Hypertension   . Vision abnormalities     PAST SURGICAL HISTORY: Past Surgical History  Procedure Laterality Date  . Vesicovaginal fistula closure w/ tah      FAMILY HISTORY: Family History  Problem Relation Age of Onset  . Breast cancer Mother   . Hypertension Mother   . Huntington's disease Father   . Huntington's disease Sister     SOCIAL HISTORY:  Social History   Social History  . Marital Status: Married    Spouse Name: N/A  . Number of Children: N/A  . Years of Education: N/A   Occupational History  . Not on file.   Social History Main Topics  . Smoking status: Never Smoker   . Smokeless tobacco: Not on file  . Alcohol Use: No  . Drug Use: No  . Sexual Activity: Not on file   Other Topics Concern  . Not on file   Social History Narrative     PHYSICAL EXAM  Filed Vitals:   01/02/16 1308  BP: 122/64  Pulse: 74  Resp: 18  Height:  (1.575 m)  Weight: 211 lb 3.2 oz (95.8 kg)    Body mass index is 38.62 kg/(m^2).   General: The patient is well-developed and well-nourished and in no acute distress.  Musculoskeletal:   She is tender over the lower thoracic spine and the lower right ribs.   Neurologic Exam  Mental status: The patient is alert and oriented  x 3 at the time of the examination. The patient has reduced recent memory ( 2/3  and 3/3 list  prompt), with a reduced attention span and concentration ability .     Speech is normal.  Cranial nerves: Extraocular movements are full.  No neglect or extinction.   Facial symmetry is present. There is good facial sensation to soft touch bilaterally.Facial strength is normal.  Trapezius and sternocleidomastoid strength is normal. No dysarthria is noted. No obvious hearing deficits are noted.  Motor:  Muscle bulk is normal.   Tone is normal. Strength is  5 / 5 in all 4 extremities.   Sensory: Sensory testing is intact to pinprick, soft touch and vibration sensation in arms and reduced right S1 sensation in legs.  Coordination: Cerebellar testing reveals good finger-nose-finger and heel-to-shin bilaterally.  Gait and station: Station is normal.   Gait is mildly wide stanced . Tandem gait is poor. Romberg is positive.   Reflexes: Deep tendon reflexes are symmetric and normal bilaterally in arms and absent in both legs.     DIAGNOSTIC DATA (LABS, IMAGING, TESTING) - I reviewed patient records, labs, notes, testing and imaging myself where available.    ASSESSMENT AND PLAN  Cardioembolic stroke (HCC)  FO (foramen ovale)  Obstructive apnea  Closed wedge fracture of thoracic vertebra with routine healing, unspecified thoracic vertebral level, subsequent encounter - Plan: DG Thoracic Spine 2 View  Mid back pain - Plan: DG Thoracic Spine 2 View  Cognitive decline  Chronic back pain  Neck pain  Rib pain on right side - Plan: DG Ribs Unilateral Right    1.   Renew Nucynta and Lyrica   2.   Continue Plavix and ASA 3.   X-rays of thoracic spine and right ribs to make sure that there has not been any fracture. 4.    Use cane, even when inside the house.   rtc 4 months or sooner if there are new or worsening neurologic symptoms. Richard A. Epimenio Foot, MD, PhD 01/02/2016, 1:12 PM Certified in Neurology, Clinical Neurophysiology, Sleep Medicine, Pain Medicine and Neuroimaging  [facility] [address] [place], [place] [address] [phone]

## 2016-01-10 ENCOUNTER — Telehealth: Payer: Self-pay | Admitting: Neurology

## 2016-01-10 NOTE — Telephone Encounter (Signed)
Called patient - left message advising her to go to [facility] for xrays - no appt necessary.

## 2016-01-10 NOTE — Telephone Encounter (Signed)
Pt called inquiring about when xray will be scheduled Dr Epimenio Foot discussed with her on 01/02/16. Please call

## 2016-05-01 ENCOUNTER — Ambulatory Visit (INDEPENDENT_AMBULATORY_CARE_PROVIDER_SITE_OTHER): Payer: Medicare Other | Admitting: Neurology

## 2016-05-01 ENCOUNTER — Encounter: Payer: Self-pay | Admitting: Neurology

## 2016-05-01 VITALS — BP 118/64 | HR 76 | Resp 20 | Ht 62.0 in | Wt 199.5 lb

## 2016-05-01 DIAGNOSIS — G4733 Obstructive sleep apnea (adult) (pediatric): Secondary | ICD-10-CM

## 2016-05-01 DIAGNOSIS — M542 Cervicalgia: Secondary | ICD-10-CM

## 2016-05-01 DIAGNOSIS — G8929 Other chronic pain: Secondary | ICD-10-CM

## 2016-05-01 DIAGNOSIS — R0781 Pleurodynia: Secondary | ICD-10-CM | POA: Diagnosis not present

## 2016-05-01 DIAGNOSIS — R4189 Other symptoms and signs involving cognitive functions and awareness: Secondary | ICD-10-CM

## 2016-05-01 DIAGNOSIS — M549 Dorsalgia, unspecified: Secondary | ICD-10-CM

## 2016-05-01 MED ORDER — LYRICA 200 MG PO CAPS: 200.0000 mg | ORAL_CAPSULE | Freq: Two times a day (BID) | ORAL | Status: DC

## 2016-05-01 MED ORDER — DIAZEPAM 10 MG PO TABS: 10.0000 mg | ORAL_TABLET | Freq: Every evening | ORAL | Status: DC | PRN

## 2016-05-01 MED ORDER — NUCYNTA 75 MG PO TABS: ORAL_TABLET | ORAL | Status: DC

## 2016-05-01 NOTE — Progress Notes (Signed)
[facility]  PATIENT: Stacey Jennings DOB: 1950/11/21  REFERRING PCP:  [name] (fax [phone])  _________________________________   HISTORICAL  CHIEF COMPLAINT:  Chief Complaint  Patient presents with  . 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  Sts. thoracic pain improved, now is worse again.  She will go for x-rays of T-spine as ordered at last ov. Sts. pcp was concerned about the amt. of Diazepam pt. was taking, so she d/c Diazepam and rx'd Lorazepam 0.5mg .  She didn't feel Lorazepam helped, so she stopped it and restarted Diazepam. Sts. pcp is also concerned that she is sleeping too much and wonders if the Nucynta and Lyrica are causing excessive drowsiness./fim  . Back Pain  . Neck Pain    HISTORY OF PRESENT ILLNESS:  Stacey Jennings is a 65 year old woman with chronic pain,  h/o cardio-embolic stroke, reduced memory, OSA.       Fall/rib/back pain:     She had a fall earlier this year when her left leg gave out upon standing. She has had pain in thoracic spine and ribs since.    Pain fluctuates.    She never got the x-rays we ordered.    She feels pain is similar to pain when she had a T7 fracture (around June 2016).    She notes gait is poor many days and better other days.    Vitamin B12 and copper were normal in December 2015.   Other Pain:   Neck pain and lower back pain are doing about the same.     She notes benefit from Nucynta 75 mg (takes 1-2 a day) and Lyrica 200 mg bid  Insomnia:   She has been on valium 10 mg nightly x many years.    5 mg has not helped her sleep enough in the past.  CVA:   She has had no other stroke like symptoms or TI.    She had a cardioembolic stroke May 2016 with confusion.  She was told she had a stroke a couple weeks earlier and they felt it may have been cardioembolic.   A TEE was done apparently showing an ASD.     We never got the MRI's   Memory/mood:   She feels forgetful the past few  years, but she and husband feel she is the same as last visit.        She has had depression or anxiety. She has not been on an antidepressant recently.    Her daughters have told them she has had more changes than she and husband have noted   OSA: She has severe OSA with AHI equals 82.3. She was titrated to CPAP +13.  She is compliant.     She is urinating less at night but has not noted any other improvement.     DM/cardiac:   She has been placed on Invokana and sugars still not well controlled (sees Dr. Allena Katz).   She sees Dr. Beverely Pace  Studies:   MRI of the cervical spine dated 10/04/2014 shows prior fusion at C6-C7 with some posterior bony prominence and left uncovertebral spurring causing mild left foraminal narrowing but no nerve root impingement at this level. A small focus of hyperintensity within the spinal cord adjacent to this level.  At C5-C6, there is mild spinal stenosis due to broad disc protrusion and facet hypertrophy there is mild foraminal narrowing. There is no myelopathic spinal cord changes. Lumbar MRI dated 02/25/2012 shows  prior L4-S1 fusion and a central disc herniation at L5-S1 displacing both S1 nerve roots, right greater than left with some edema noted in the right S1 nerve root. There is prior surgical fusion at L4-S1. There is severe bilateral facet hypertrophy at L2-L3 and L3-L4.   MRI of the brain 03/15/2015, before her stroke, showed 2 small lacunar infarctions in the left frontal lobe and some scattered chronic microvascular ischemic changes.    We never got the MRI images from [place].  [name]    REVIEW OF SYSTEMS: Constitutional: No fevers, chills, sweats, or change in appetite.  Notes fatigue Eyes: No visual changes, double vision, eye pain Ear, nose and throat: No hearing loss, ear pain, nasal congestion, sore throat Cardiovascular: No chest pain, palpitations Respiratory: No shortness of breath at rest or with exertion.   No wheezes GastrointestinaI: No nausea,  vomiting, diarrhea, abdominal pain, fecal incontinence Genitourinary: No dysuria, urinary retention or frequency.  No nocturia. Musculoskeletal: as above Integumentary: No rash, pruritus, skin lesions Neurological: as above Psychiatric: No current depression at this time.  No anxiety  Endocrine: No palpitations, diaphoresis, change in appetite, change in weight or increased thirst Hematologic/Lymphatic: No anemia, purpura, petechiae. Allergic/Immunologic: No itchy/runny eyes, nasal congestion, recent allergic reactions, rashes  ALLERGIES: Allergies  Allergen Reactions  . Codeine Other (See Comments)    hypersomnia  . Cyclobenzaprine     Other reaction(s): Other (See Comments)  . Fentanyl Other (See Comments)    Hypersomnia  . Haldol [Haloperidol Lactate] Other (See Comments)    hallucinations    HOME MEDICATIONS:  Current outpatient prescriptions:  .  amLODipine (NORVASC) 5 MG tablet, Take 5 mg by mouth daily., Disp: , Rfl:  .  aspirin EC 81 MG tablet, Take 81 mg by mouth daily., Disp: , Rfl:  .  clopidogrel (PLAVIX) 75 MG tablet, Take 75 mg by mouth daily., Disp: , Rfl:  .  diazepam (VALIUM) 10 MG tablet, Take 1 tablet (10 mg total) by mouth at bedtime as needed., Disp: 90 tablet, Rfl: 1 .  DULoxetine (CYMBALTA) 60 MG capsule, Take 1 capsule (60 mg total) by mouth daily., Disp: 90 capsule, Rfl: 3 .  enalapril (VASOTEC) 20 MG tablet, Take 20 mg by mouth 2 (two) times daily., Disp: , Rfl:  .  famotidine (PEPCID) 40 MG tablet, Take 40 mg by mouth daily.,  Disp: , Rfl:  .  levothyroxine (SYNTHROID, LEVOTHROID) 125 MCG tablet, daily., Disp: , Rfl:  .  LYRICA 200 MG capsule, Take 1 capsule (200 mg total) by mouth 2 (two) times daily., Disp: 180 capsule, Rfl: 1 .  metFORMIN (GLUCOPHAGE) 500 MG tablet, Take 500 mg by mouth daily., Disp: , Rfl:  .  metoprolol (TOPROL-XL) 200 MG 24 hr tablet, Take 200 mg by mouth daily., Disp: , Rfl:  .  NUCYNTA 75 MG TABS, Take one tablet 4 times daily as needed for pain.  Pharmacy may dispense one 30 day supply with 2 additional refills, or one 90 day supply., Disp: 360 tablet, Rfl: 0 .  VICTOZA 18 MG/3ML SOPN, , Disp: , Rfl:   PAST MEDICAL HISTORY: Past Medical History  Diagnosis Date  . Diabetes mellitus without complication (HCC)   . Headache   . Hypertension   . Vision abnormalities     PAST SURGICAL HISTORY: Past Surgical History  Procedure Laterality Date  . Vesicovaginal fistula closure w/ tah      FAMILY HISTORY: Family History  Problem Relation Age of Onset  . Breast cancer Mother   . Hypertension Mother   . Huntington's disease Father   . Huntington's disease Sister     SOCIAL HISTORY:  Social History   Social History  . Marital Status: Married    Spouse Name: N/A  . Number of Children: N/A  . Years of Education: N/A   Occupational History  . Not on file.   Social History Main Topics  . Smoking status: Never Smoker   . Smokeless tobacco: Not on file  . Alcohol Use: No  . Drug Use: No  . Sexual Activity: Not on file   Other Topics Concern  . Not on file   Social History Narrative     PHYSICAL EXAM  Filed Vitals:   05/01/16 1314  BP: 118/64  Pulse: 76  Resp: 20  Height: 5\' 2"  (1.575 m)  Weight: 199 lb 8 oz (90.493 kg)    Body mass index is 36.48 kg/(m^2).   General: The patient is well-developed and well-nourished and in no acute distress.  Musculoskeletal:   She is tender over the lower thoracic spine and  the lower right ribs.   Neurologic Exam  Mental  status: The patient is alert and oriented x 3 at the time of the examination. The patient has mildly reduced recent memory ( 2/3  and 3/3 list prompt), with a ok attention span (WORLD-DLROW) and concentration ability .     Speech is normal.  Cranial nerves: Extraocular movements are full.  No neglect or extinction.   Facial symmetry is present. There is good facial sensation to soft touch bilaterally.Facial strength is normal.  Trapezius and sternocleidomastoid strength is normal. No dysarthria is noted. No obvious hearing deficits are noted.  Motor:  Muscle bulk is normal.   Tone is normal. Strength is  5 / 5 in all 4 extremities.   Sensory: Sensory testing is intact to pinprick, soft touch and vibration sensation in arms and reduced right S1 sensation in legs.   Reduced vibration in toes.    Coordination: Cerebellar testing reveals good finger-nose-finger and ok heel-to-shin bilaterally.  Gait and station: Station is normal.   Gait is mildly wide stanced . Tandem gait is poor. Romberg is positive.   Reflexes: Deep tendon reflexes are symmetric and normal bilaterally in arms and absent in both legs.      DIAGNOSTIC DATA (LABS, IMAGING, TESTING) - I reviewed patient records, labs, notes, testing and imaging myself where available.    ASSESSMENT AND PLAN  Chronic back pain  Neck pain  Disturbed cognition  Obstructive sleep apnea  Mid back pain  Rib pain on right side  Cognitive decline     1.   She has been on a stable pain regimen for > year with Lyrica 200 mg by mouth twice a day and Nucynta 75 mg, usually just 1 or 2 a day. Nucynta chosen over opiate to reduce lethargy and because of OSA.   She is also on valium 10 mg, only at night.   She appears to have her baseline level of awareness today.   She does not feel there is any difference in lethargy on days she takes 2 Nucynta as compared to days that she takes just one. Renew Nucynta and Lyrica   2.   Continue Plavix and  ASA 3.   X-rays of thoracic spine and right ribs to make sure that there has not been any fracture. 4.   Use cane or walker, even when inside the house.   rtc 4 months or sooner if there are new or worsening neurologic symptoms.  Richard A. Epimenio Foot, MD, PhD 05/01/2016, 1:19 PM Certified in Neurology, Clinical Neurophysiology, Sleep Medicine, Pain Medicine and Neuroimaging  [facility] [address] [place], [place] [address] [phone]

## 2016-06-05 ENCOUNTER — Other Ambulatory Visit: Payer: Medicare Other

## 2016-06-05 ENCOUNTER — Other Ambulatory Visit: Payer: Self-pay | Admitting: Neurology

## 2016-06-05 ENCOUNTER — Ambulatory Visit
Admission: RE | Admit: 2016-06-05 | Discharge: 2016-06-05 | Disposition: A | Discharge status: Home / Self Care | Payer: Medicare Other | Source: Ambulatory Visit | Attending: Neurology | Admitting: Neurology

## 2016-06-05 DIAGNOSIS — M549 Dorsalgia, unspecified: Secondary | ICD-10-CM

## 2016-06-05 DIAGNOSIS — S22000D Wedge compression fracture of unspecified thoracic vertebra, subsequent encounter for fracture with routine healing: Secondary | ICD-10-CM

## 2016-06-05 DIAGNOSIS — R0781 Pleurodynia: Secondary | ICD-10-CM

## 2016-06-06 ENCOUNTER — Telehealth: Payer: Self-pay | Admitting: *Deleted

## 2016-06-06 NOTE — Telephone Encounter (Signed)
-----   Message from Asa Lente, MD sent at 06/05/2016  5:52 PM EDT ----- Please let her know that the x-rays do not show any rib fractures. She has an old compression fracture at T7 that was also seen in 2016.    Nothing looks new

## 2016-06-06 NOTE — Telephone Encounter (Signed)
LMTC cell#/fim 

## 2016-06-06 NOTE — Telephone Encounter (Signed)
-----   Message from Richard A Sater, MD sent at 06/05/2016  5:52 PM EDT ----- Please let her know that the x-rays do not show any rib fractures. She has an old compression fracture at T7 that was also seen in 2016.    Nothing looks new 

## 2016-06-10 NOTE — Telephone Encounter (Signed)
Spoke to patient she is aware of results

## 2016-06-10 NOTE — Telephone Encounter (Signed)
Pt returned call. Please call. Pt will be leaving to go out of town this afternoon please call cell [phone]

## 2016-07-06 DIAGNOSIS — N289 Disorder of kidney and ureter, unspecified: Secondary | ICD-10-CM | POA: Insufficient documentation

## 2016-07-06 DIAGNOSIS — E860 Dehydration: Secondary | ICD-10-CM | POA: Insufficient documentation

## 2016-09-02 ENCOUNTER — Telehealth: Payer: Self-pay | Admitting: Neurology

## 2016-09-02 NOTE — Telephone Encounter (Signed)
LMTC./fim 

## 2016-09-02 NOTE — Telephone Encounter (Signed)
Pt called requested to be seen by Dr Epimenio FootSater. She advised she has fallen 2 x since 07/22/16. The occipital area of the head is sensitive where the cpap band goes around her head. An appt was made for 11/1 with Dr Epimenio FootSater. Pt was advised RN would call if there were any questions.

## 2016-09-03 NOTE — Telephone Encounter (Signed)
LMTC./fim 

## 2016-09-05 NOTE — Telephone Encounter (Signed)
I have spoken with Stacey Jennings this monring.  She denies further falls, new sx.  She will keep pending appt. with RAS/fim

## 2016-09-05 NOTE — Telephone Encounter (Signed)
Pt returned call. Please call and advise °

## 2016-09-11 ENCOUNTER — Ambulatory Visit (INDEPENDENT_AMBULATORY_CARE_PROVIDER_SITE_OTHER): Payer: Medicare Other | Admitting: Neurology

## 2016-09-11 ENCOUNTER — Encounter: Payer: Self-pay | Admitting: Neurology

## 2016-09-11 VITALS — BP 100/64 | HR 66 | Resp 20 | Ht 62.0 in | Wt 199.5 lb

## 2016-09-11 DIAGNOSIS — R4189 Other symptoms and signs involving cognitive functions and awareness: Secondary | ICD-10-CM | POA: Diagnosis not present

## 2016-09-11 DIAGNOSIS — R2689 Other abnormalities of gait and mobility: Secondary | ICD-10-CM

## 2016-09-11 DIAGNOSIS — Q211 Atrial septal defect, unspecified: Secondary | ICD-10-CM

## 2016-09-11 DIAGNOSIS — I639 Cerebral infarction, unspecified: Secondary | ICD-10-CM

## 2016-09-11 DIAGNOSIS — G4733 Obstructive sleep apnea (adult) (pediatric): Secondary | ICD-10-CM | POA: Diagnosis not present

## 2016-09-11 DIAGNOSIS — M542 Cervicalgia: Secondary | ICD-10-CM

## 2016-09-11 MED ORDER — LYRICA 200 MG PO CAPS: 200.0000 mg | ORAL_CAPSULE | Freq: Every day | ORAL | 1 refills | Status: DC

## 2016-09-11 MED ORDER — DIAZEPAM 10 MG PO TABS: 10.0000 mg | ORAL_TABLET | Freq: Every evening | ORAL | 1 refills | Status: DC | PRN

## 2016-09-11 NOTE — Progress Notes (Signed)
[facility]  PATIENT: Stacey Jennings DOB: 03/25/1951  REFERRING PCP:  Micael HampshireKathy Brown (fax [phone])  _________________________________   HISTORICAL  CHIEF COMPLAINT:  Chief Complaint  Patient presents with  . 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 she was admitted to [facility] in August with mental status changes--was told she was dehydrated.  Sts. she lost her balance and fell 08-21-16--hit her head on her nightstand.  No loc.  Was immed. alert, oriented.  Sts. has had some degree of h/a since then./fim  . Back Pain  . Neck Pain    HISTORY OF PRESENT ILLNESS:  Stacey Jennings is a 65 year old woman with chronic pain,  h/o cardio-embolic stroke, reduced memory, OSA.     She was hospitalized in August and fell 08/21/16.    She was hospitalized for acute mental status changes and the hospital recommended that she stop the prn Nucynta and the Lyrica.   She was also found to have sufficiency (creatinine was 2.2) to be dehydrated and had hyperglycemia which likely contributed more to her cognitive changes.    She fell 08/21/16 (backwards) and hit the nightstand.  Since then she has had a headache on the right radiatinf from the occiput to the vertex.    Neck pain and HA:   Neck pain and lower back pain are doing about the same.  However, she has had a headache since the last fall when she hit a nightstand.   Turning her head increases pain and she feels her neck freezes some.      She notes spine pain is helped by prn Nucynta 75 mg (takes 1-2 a day) and Lyrica 200 mg bid.  Rib/back pain:  She reports the rib and thoracic spine pain is better than last visit.    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   Pain increased after a fall before her last visit.     Insomnia:   She has been on valium 10 mg nightly x many years.    5 mg has not helped her sleep enough in the past.  CVA:   She had a cardioembolic stroke May 2016 with confusion.   She has had no other stroke like symptoms or TIA.  A TEE was done apparently showing an ASD.     We never got the MRI's or all her records.   She is on Plavix plus ASA.    Memory/mood:   She feels forgetful the past few years, but she and husband feel she is the same as last visit.        She has had depression or anxiety. She has not been on an antidepressant recently.    Her daughters have told them she has had more changes than she and husband have noted   OSA: She has severe OSA with AHI equals 82.3. She was titrated to CPAP +13.  She is compliant.     She is urinating less at night but has not noted any other improvement.      Studies:   MRI of the cervical spine dated 10/04/2014 shows prior fusion at C6-C7 with some posterior bony prominence and left uncovertebral spurring causing mild left foraminal narrowing but no nerve root impingement at this level. A small focus of hyperintensity within the spinal cord adjacent to this level.  At C5-C6, there is mild spinal stenosis due to broad disc protrusion and facet hypertrophy there is mild foraminal narrowing. There is  no myelopathic spinal cord changes. 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 There is prior surgical fusion at L4-S1. There is severe bilateral facet hypertrophy at L2-L3 and L3-L4.   MRI of the brain 03/15/2015, before her stroke, showed 2 small lacunar infarctions in the left frontal lobe and some scattered chronic microvascular ischemic changes.    We never got the MRI images from [place].  [name]    REVIEW OF SYSTEMS: Constitutional: No fevers, chills, sweats, or change in appetite.  Notes fatigue Eyes: No visual changes, double vision, eye pain Ear, nose and throat: No hearing loss, ear pain, nasal congestion, sore throat Cardiovascular: No chest pain, palpitations Respiratory:  No shortness of breath at rest or with exertion.   No wheezes GastrointestinaI: No nausea, vomiting, diarrhea, abdominal pain, fecal incontinence Genitourinary: No dysuria, urinary retention or frequency.  No nocturia. Musculoskeletal: as above Integumentary: No rash, pruritus, skin lesions Neurological: as above Psychiatric: No current depression at this time.  No anxiety Endocrine: She has DM.   No palpitations, diaphoresis, change in appetite, change in weight or increased thirst Hematologic/Lymphatic: No anemia, purpura, petechiae. Allergic/Immunologic: No itchy/runny eyes, nasal congestion, recent allergic reactions, rashes  ALLERGIES: Allergies  Allergen Reactions  . Codeine Other (See Comments)    hypersomnia  . Cyclobenzaprine     Other reaction(s): Other (See Comments)  . Fentanyl Other (See Comments)    Hypersomnia  . Haldol [Haloperidol Lactate] Other (See Comments)    hallucinations    HOME MEDICATIONS:  Current Outpatient Prescriptions:  .  amLODipine (NORVASC) 5 MG tablet, Take 5 mg by mouth daily., Disp: , Rfl:  .  clopidogrel (PLAVIX) 75 MG tablet, Take 75 mg by mouth daily., Disp: , Rfl:  .  diazepam (VALIUM) 10 MG tablet, Take 1 tablet (10 mg total) by mouth at bedtime as needed., Disp: 90 tablet, Rfl: 1 .  DULoxetine (CYMBALTA) 60 MG capsule, Take 1 capsule (60 mg total) by mouth daily., Disp: 90 capsule, Rfl: 3 .  enalapril (VASOTEC) 20 MG tablet, Take 20 mg by mouth 2 (two) times daily., Disp: , Rfl:  .  famotidine (PEPCID) 40 MG tablet, Take 40 mg by mouth daily., Disp: , Rfl:  .  levothyroxine (SYNTHROID, LEVOTHROID) 125 MCG tablet, daily., Disp: , Rfl:  .  LYRICA 200 MG capsule, Take 1 capsule (200 mg total) by mouth 2 (two) times daily., Disp: 180 capsule, Rfl: 1 .  metoprolol (TOPROL-XL) 200 MG 24 hr tablet, Take 200 mg by mouth daily., Disp: , Rfl:  .  NUCYNTA 75 MG TABS, Take one tablet 4 times daily as needed for pain.  Pharmacy may dispense one 30  day supply with 2 additional refills, or one 90 day supply., Disp: 360 tablet, Rfl: 0 .  risperiDONE (RISPERDAL) 0.5 MG tablet, Take 0.5 mg by mouth., Disp: , Rfl:  .  VICTOZA 18 MG/3ML SOPN, , Disp: , Rfl:  .  metFORMIN (GLUCOPHAGE) 500 MG tablet, Take 500 mg by mouth daily., Disp: , Rfl:   PAST MEDICAL HISTORY: Past Medical History:  Diagnosis Date  . Diabetes mellitus without complication (HCC)   . Headache   . Hypertension   . Vision abnormalities     PAST SURGICAL HISTORY: Past Surgical History:  Procedure Laterality Date  . VESICOVAGINAL FISTULA CLOSURE W/ TAH      FAMILY  HISTORY: Family History  Problem Relation Age of Onset  . Breast cancer Mother   . Hypertension Mother   . Huntington's disease Father   . Huntington's disease Sister     SOCIAL HISTORY:  Social History   Social History  . Marital status: Married    Spouse name: N/A  . Number of children: N/A  . Years of education: N/A   Occupational History  . Not on file.   Social History Main Topics  . Smoking status: Never Smoker  . Smokeless tobacco: Not on file  . Alcohol use No  . Drug use: No  . Sexual activity: Not on file   Other Topics Concern  . Not on file   Social History Narrative  . No narrative on file     PHYSICAL EXAM  Vitals:   09/11/16 1135  BP: 100/64  Pulse: 66  Resp: 20  Weight: 199 lb 8 oz (90.5 kg)  Height: 5\' 2"  (1.575 m)    Body mass index is 36.49 kg/m.   General: The patient is well-developed and well-nourished and in no acute distress.  Musculoskeletal:   She is tender over the lower thoracic spine and the lower right ribs.   Neurologic Exam  Mental status: The patient is alert and oriented x 3 at the time of the examination. The patient has mildly reduced recent memory ( 2/3  and 3/3 list prompt), with normal attention span [facility](WORLD-DLROW) .     Speech is normal.  Cranial nerves: Extraocular movements are full.  No neglect or extinction.   Facial  symmetry is present. There is good facial sensation to soft touch bilaterally.Facial strength is normal.  Trapezius and sternocleidomastoid strength is normal. No dysarthria is noted. No obvious hearing deficits are noted.  Motor:  Muscle bulk is normal.   Tone is normal. Strength is  5 / 5 in all 4 extremities.   Sensory: Sensory testing is intact to pinprick, soft touch and vibration sensation in arms and reduced right S1 sensation in legs.  She also has reduced vibration in toes.    Coordination: Cerebellar testing reveals good finger-nose-finger and ok heel-to-shin bilaterally.  Gait and station: Station is normal.   Gait is mildly wide stanced . Tandem gait is poor. Romberg is positive.   Reflexes: Deep tendon reflexes are symmetric and normal bilaterally in arms and absent in both legs.      DIAGNOSTIC DATA (LABS, IMAGING, TESTING) - I reviewed patient records, labs, notes, testing and imaging myself where available.    ASSESSMENT AND PLAN  Cardioembolic stroke (HCC)  Poor balance  Neck pain  Obstructive sleep apnea  ASD (atrial septal defect)  Disturbed cognition     1.   She will reduce the Lyrica to once a day (night) and valium to just bedtime.   She can take Nucynta 75 mg,up to 2 a day. Nucynta chosen over opiate to reduce lethargy and because of OSA.    2.   Continue Plavix and ASA 3.    TPI right splenius capitus muscle with 40 mg Depo-Medrol in [place] using sterile technique.     4.   Use cane or walker, even when inside the house.   rtc 4 months or sooner if there are new or worsening neurologic symptoms.  Karlo Goeden A. Epimenio FootSater, MD, PhD 09/11/2016, 11:41 AM Certified in Neurology, Clinical Neurophysiology, Sleep Medicine, Pain Medicine and Neuroimaging  [facility] [address], [address] [place], [place] [id number] [phone]

## 2016-10-01 ENCOUNTER — Ambulatory Visit: Payer: Medicare Other | Admitting: Neurology

## 2017-01-02 DIAGNOSIS — T424X1A Poisoning by benzodiazepines, accidental (unintentional), initial encounter: Secondary | ICD-10-CM | POA: Insufficient documentation

## 2017-01-02 DIAGNOSIS — G9340 Encephalopathy, unspecified: Secondary | ICD-10-CM | POA: Insufficient documentation

## 2017-01-02 DIAGNOSIS — Z79899 Other long term (current) drug therapy: Secondary | ICD-10-CM | POA: Insufficient documentation

## 2017-03-31 ENCOUNTER — Ambulatory Visit (INDEPENDENT_AMBULATORY_CARE_PROVIDER_SITE_OTHER): Payer: Medicare Other | Admitting: Neurology

## 2017-03-31 ENCOUNTER — Encounter (INDEPENDENT_AMBULATORY_CARE_PROVIDER_SITE_OTHER): Payer: Self-pay

## 2017-03-31 ENCOUNTER — Encounter: Payer: Self-pay | Admitting: Neurology

## 2017-03-31 VITALS — BP 128/79 | HR 65 | Resp 18 | Ht 62.0 in | Wt 182.5 lb

## 2017-03-31 DIAGNOSIS — R4189 Other symptoms and signs involving cognitive functions and awareness: Secondary | ICD-10-CM | POA: Diagnosis not present

## 2017-03-31 DIAGNOSIS — E119 Type 2 diabetes mellitus without complications: Secondary | ICD-10-CM

## 2017-03-31 DIAGNOSIS — G8929 Other chronic pain: Secondary | ICD-10-CM

## 2017-03-31 DIAGNOSIS — M5441 Lumbago with sciatica, right side: Secondary | ICD-10-CM | POA: Diagnosis not present

## 2017-03-31 DIAGNOSIS — Z8673 Personal history of transient ischemic attack (TIA), and cerebral infarction without residual deficits: Secondary | ICD-10-CM

## 2017-03-31 DIAGNOSIS — G4733 Obstructive sleep apnea (adult) (pediatric): Secondary | ICD-10-CM

## 2017-03-31 DIAGNOSIS — M5442 Lumbago with sciatica, left side: Secondary | ICD-10-CM

## 2017-03-31 MED ORDER — NUCYNTA 75 MG PO TABS: ORAL_TABLET | ORAL | 0 refills | Status: DC

## 2017-03-31 MED ORDER — PREGABALIN 100 MG PO CAPS: 100.0000 mg | ORAL_CAPSULE | Freq: Two times a day (BID) | ORAL | 1 refills | Status: DC

## 2017-03-31 NOTE — Progress Notes (Signed)
[facility]  PATIENT: Stacey Jennings DOB: 1951-05-23  REFERRING PCP:  [name] (fax [phone])  _________________________________   HISTORICAL  CHIEF COMPLAINT:  Chief Complaint  Patient presents with  . 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 she stopped Diazpam and feels much better since doing so.  Denies new stroke sx.  Has lost 17 lbs.  Sts. h/a's are less frequent, less severe. Requests r/f of Nucynta and Lyrica/fim  . Sleep Apnea  . Neck Pain  . Back Pain    HISTORY OF PRESENT ILLNESS:  Stacey Jennings is a 66 year old woman with chronic pain,  h/o cardio-embolic stroke, reduced memory, OSA.     She was hospitalized in August and fell 08/21/16.    She is generally feeling better.  She had a 1 day admission for confusion 2 months ago.     She stopped diazepam at night and is using OTC Unisom.  No more spells of confusion.    She feels more alert.   She has started driving again.     CVA:   She had a cardioembolic stroke May 2016 with confusion.  She has had no other stroke like symptoms or TIA.  A TEE was done apparently showing an ASD.     We never got the MRI's or all her records.   She is on Plavix plus ASA.    Neck pain and HA:   Neck pain is doing better.   Headache is better.   When it flares up, she takes Nucynta.  Lyrica has helped,.  Back pain and right leg > left leg pain:  She reports LBP that radiates down the legs, right > left.     She had acupuncture and felt bette while the needle was placed but worse again after the needle was removed.     No new weakness.   She is on Lyrica and prn Nucynta (usually 2/day)  Memory/mood:   She feels memory is doing better off the valium.  She feels her depression is better.  OSA: She has severe OSA with AHI equals 82.3. She was titrated to CPAP +13.  She is compliant.     She is urinating less at night but has not noted any other improvement.      Studies:   MRI of the cervical spine dated  10/04/2014 shows prior fusion at C6-C7 with some posterior bony prominence and left uncovertebral spurring causing mild left foraminal narrowing but no nerve root impingement at this level. A small focus of hyperintensity within the spinal cord adjacent to this level.  At C5-C6, there is mild spinal stenosis due to broad disc protrusion and facet hypertrophy there is mild foraminal narrowing. There is no myelopathic spinal cord changes. 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 There is prior surgical fusion at L4-S1. There is severe bilateral facet hypertrophy at L2-L3 and L3-L4.   MRI of the brain 03/15/2015, before her stroke, showed 2 small lacunar infarctions in the left frontal lobe and some scattered chronic microvascular ischemic changes.    We never got the MRI images from [place].  [name]    REVIEW OF SYSTEMS: Constitutional: No fevers, chills, sweats, or change in appetite.  Notes fatigue Eyes: No visual changes, double vision, eye pain Ear, nose and throat: No hearing loss, ear  pain, nasal congestion, sore throat Cardiovascular: No chest pain, palpitations Respiratory: No shortness of breath at rest or with exertion.   No wheezes GastrointestinaI: No nausea, vomiting, diarrhea, abdominal pain, fecal incontinence Genitourinary: No dysuria, urinary retention or frequency.  No nocturia. Musculoskeletal: as above Integumentary: No rash, pruritus, skin lesions Neurological: as above Psychiatric: No current depression at this time.  No anxiety Endocrine: She has DM.   No palpitations, diaphoresis, change in appetite, change in weight or increased thirst Hematologic/Lymphatic: No anemia, purpura, petechiae. Allergic/Immunologic: No itchy/runny eyes, nasal congestion, recent allergic reactions, rashes  ALLERGIES: Allergies  Allergen  Reactions  . Codeine Other (See Comments)    hypersomnia  . Cyclobenzaprine     Other reaction(s): Other (See Comments)  . Fentanyl Other (See Comments)    Hypersomnia  . Haldol [Haloperidol Lactate] Other (See Comments)    hallucinations    HOME MEDICATIONS:  Current Outpatient Prescriptions:  .  amLODipine (NORVASC) 5 MG tablet, Take 5 mg by mouth daily., Disp: , Rfl:  .  clopidogrel (PLAVIX) 75 MG tablet, Take 75 mg by mouth daily., Disp: , Rfl:  .  doxylamine, Sleep, (UNISOM) 25 MG tablet, Take 25 mg by mouth at bedtime as needed., Disp: , Rfl:  .  DULoxetine (CYMBALTA) 60 MG capsule, Take 1 capsule (60 mg total) by mouth daily., Disp: 90 capsule, Rfl: 3 .  enalapril (VASOTEC) 20 MG tablet, Take 20 mg by mouth 2 (two) times daily., Disp: , Rfl:  .  famotidine (PEPCID) 40 MG tablet, Take 40 mg by mouth daily., Disp: , Rfl:  .  levothyroxine (SYNTHROID, LEVOTHROID) 125 MCG tablet, daily., Disp: , Rfl:  .  metoprolol (TOPROL-XL) 200 MG 24 hr tablet, Take 200 mg by mouth daily., Disp: , Rfl:  .  mirabegron ER (MYRBETRIQ) 25 MG TB24 tablet, Take by mouth., Disp: , Rfl:  .  NUCYNTA 75 MG tablet, Take one tablet 4 times daily as needed for pain.  Pharmacy may dispense one 30 day supply with 2 additional refills, or one 90 day supply., Disp: 360 tablet, Rfl: 0 .  risperiDONE (RISPERDAL) 0.5 MG tablet, Take 0.5 mg by mouth., Disp: , Rfl:  .  VICTOZA 18 MG/3ML SOPN, , Disp: , Rfl:  .  diazepam (VALIUM) 10 MG tablet, Take 1 tablet (10 mg total) by mouth at bedtime as needed. (Patient not taking: Reported on 03/31/2017), Disp: 90 tablet, Rfl: 1 .  metFORMIN (GLUCOPHAGE) 500 MG tablet, Take 500 mg by mouth daily., Disp: , Rfl:  .  pregabalin (LYRICA) 100 MG capsule, Take 1 capsule (100 mg total) by mouth 2 (two) times daily., Disp: 180 capsule, Rfl: 1  PAST MEDICAL HISTORY: Past Medical History:  Diagnosis Date  . Diabetes mellitus without complication (HCC)   . Headache   . Hypertension     . Vision abnormalities     PAST SURGICAL HISTORY: Past Surgical History:  Procedure Laterality Date  . VESICOVAGINAL FISTULA CLOSURE W/ TAH      FAMILY HISTORY: Family History  Problem Relation Age of Onset  . Breast cancer Mother   . Hypertension Mother   . Huntington's disease Father   . Huntington's disease Sister     SOCIAL HISTORY:  Social History   Social History  . Marital status: Married    Spouse name: N/A  . Number of children: N/A  . Years of education: N/A   Occupational History  . Not on file.   Social History Main Topics  . Smoking  status: Never Smoker  . Smokeless tobacco: Never Used  . Alcohol use No  . Drug use: No  . Sexual activity: Not on file   Other Topics Concern  . Not on file   Social History Narrative  . No narrative on file     PHYSICAL EXAM  Vitals:   03/31/17 1133  BP: 128/79  Pulse: 65  Resp: 18  Weight: 182 lb 8 oz (82.8 kg)  Height: 5\' 2"  (1.575 m)    Body mass index is 33.38 kg/m.   General: The patient is well-developed and well-nourished and in no acute distress.  Musculoskeletal:   She is tender over the Piriformis muscles and trochanteric bursae.   Neurologic Exam  Mental status: The patient is alert and oriented x 3 at the time of the examination. The patient has improved, normal recent memory with normal attention span .     Speech is normal.  Cranial nerves: Extraocular movements are full.  No neglect or extinction.   Facial strength and sensation is normal. Trapezius and sternocleidomastoid strength is normal. No dysarthria is noted. No obvious hearing deficits are noted.  Motor:  Muscle bulk is normal.   Tone is normal. Strength is  5 / 5 in all 4 extremities.   Sensory: Touch, temperature and vibration sensation is normal in the arms and proximal legs but reduced touch is noted in the right S1 distribution and reduced vibration at the toes.    Coordination: Cerebellar testing reveals good  finger-nose-finger and ok heel-to-shin bilaterally.  Gait and station: Station is normal.   Gait is mildly wide stanced . Tandem gait is poor. Romberg is positive.   Reflexes: Deep tendon reflexes are symmetric and normal bilaterally in arms and absent in both legs.      DIAGNOSTIC DATA (LABS, IMAGING, TESTING) - I reviewed patient records, labs, notes, testing and imaging myself where available.    ASSESSMENT AND PLAN  Chronic bilateral low back pain with bilateral sciatica  Disturbed cognition  History of cardioembolic cerebrovascular accident (CVA)  Controlled type 2 diabetes mellitus without complication, without long-term current use of insulin (HCC)  Obstructive apnea    1.   Change Lyrica to 100 mg po qPM and 100 mg po qHS.    She can take Nucynta 75 mg,up to 2-3 a day. Nucynta chosen over opiate to reduce lethargy and because of OSA.    2.   Continue Plavix and ASA for stroke prophylaxis 3.    Continue to try to lose weight. 4.   rtc 4 months or sooner if there are new or worsening neurologic symptoms.  Richard A. Epimenio FootSater, MD, PhD 03/31/2017, 11:52 AM Certified in Neurology, Clinical Neurophysiology, Sleep Medicine, Pain Medicine and Neuroimaging  [facility] [address], [address] [place], [place] [address] [phone]

## 2017-07-01 IMAGING — CR DG THORACIC SPINE 3V
3 series · 3 of 3 positions shown · non-contrast
Comparison: None.

CLINICAL DATA: Pain following fall. Patient reports prior thoracic
vertebral body fracture.

EXAM:
THORACIC SPINE - 3 VIEWS

[t thoracic spine ap]
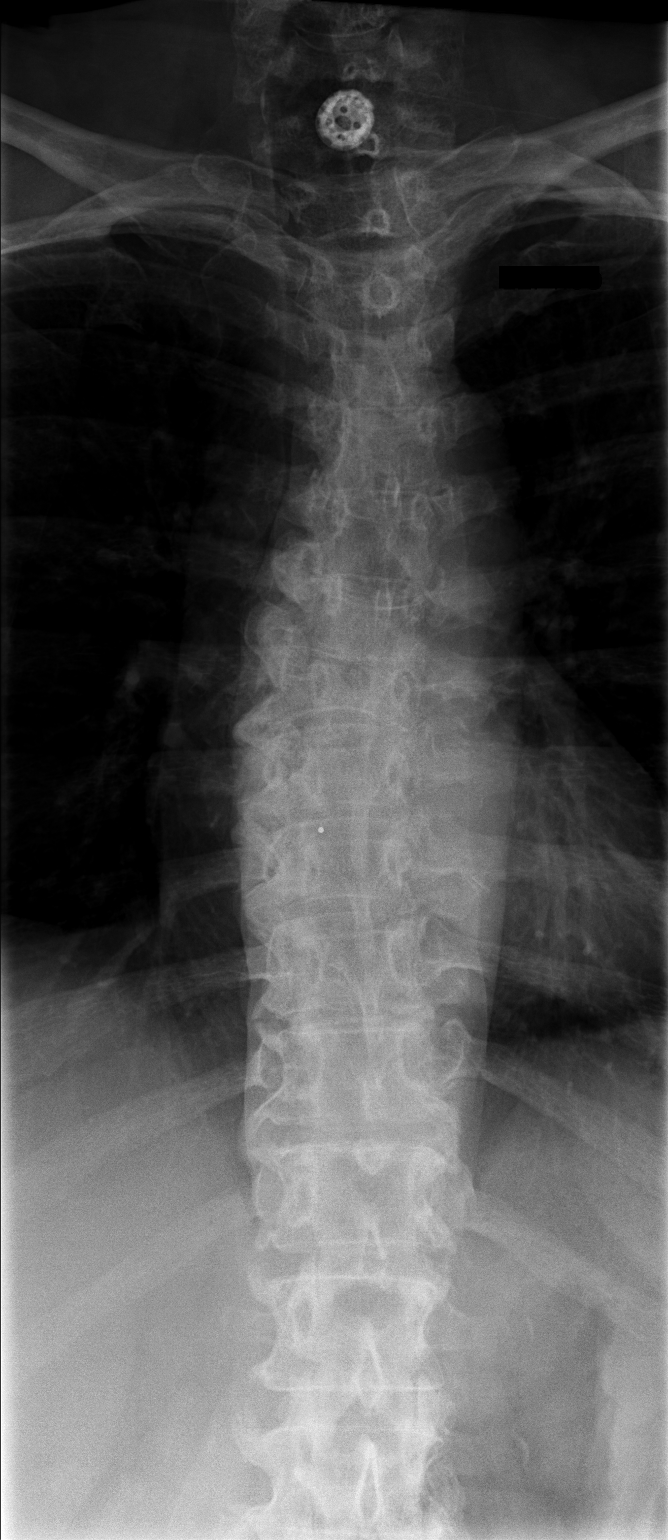

[t thoracic breathing lat]
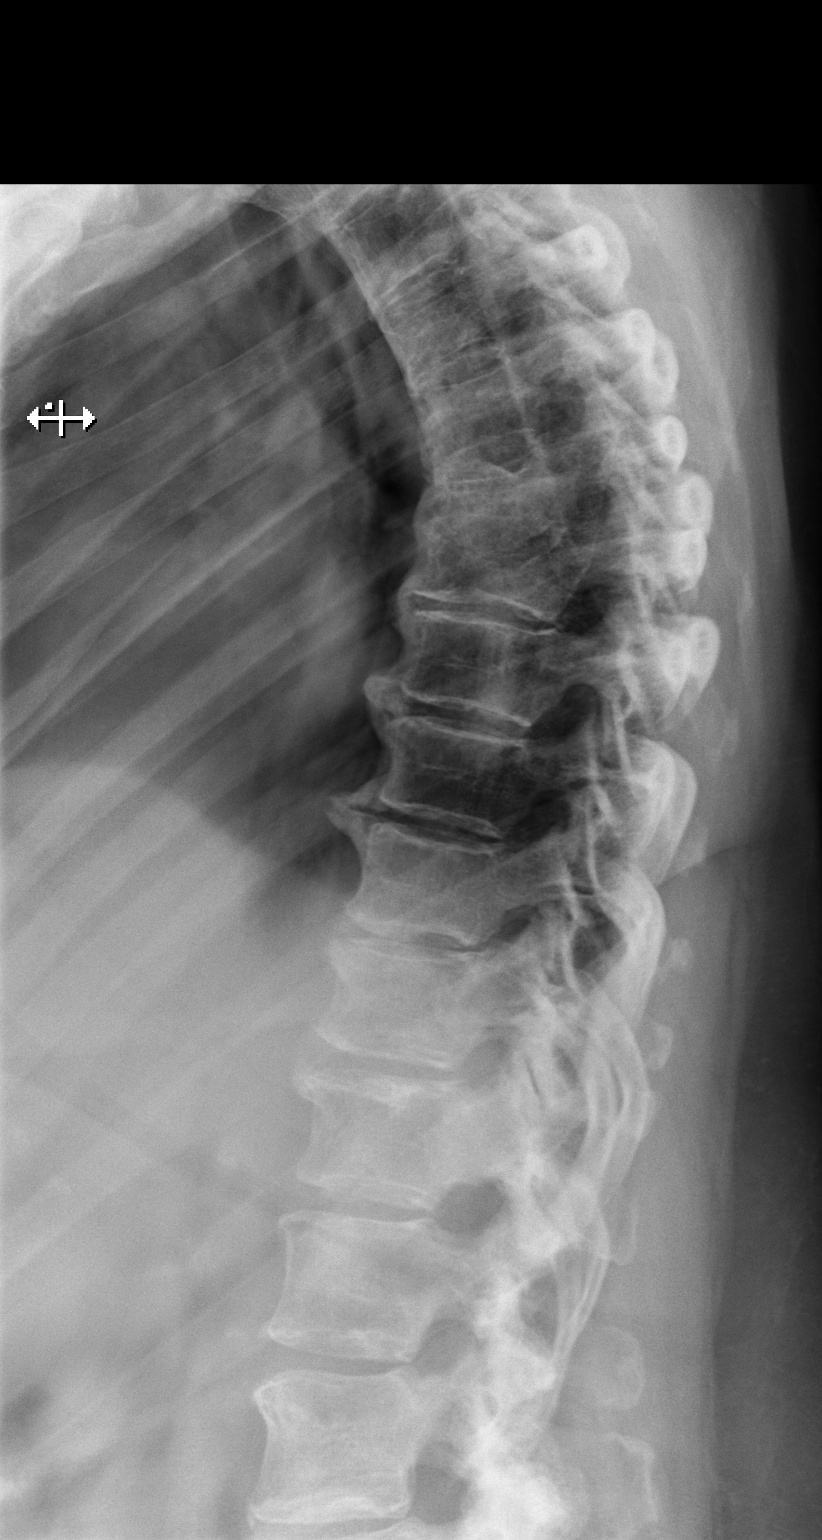

[t thoracic swimmers]
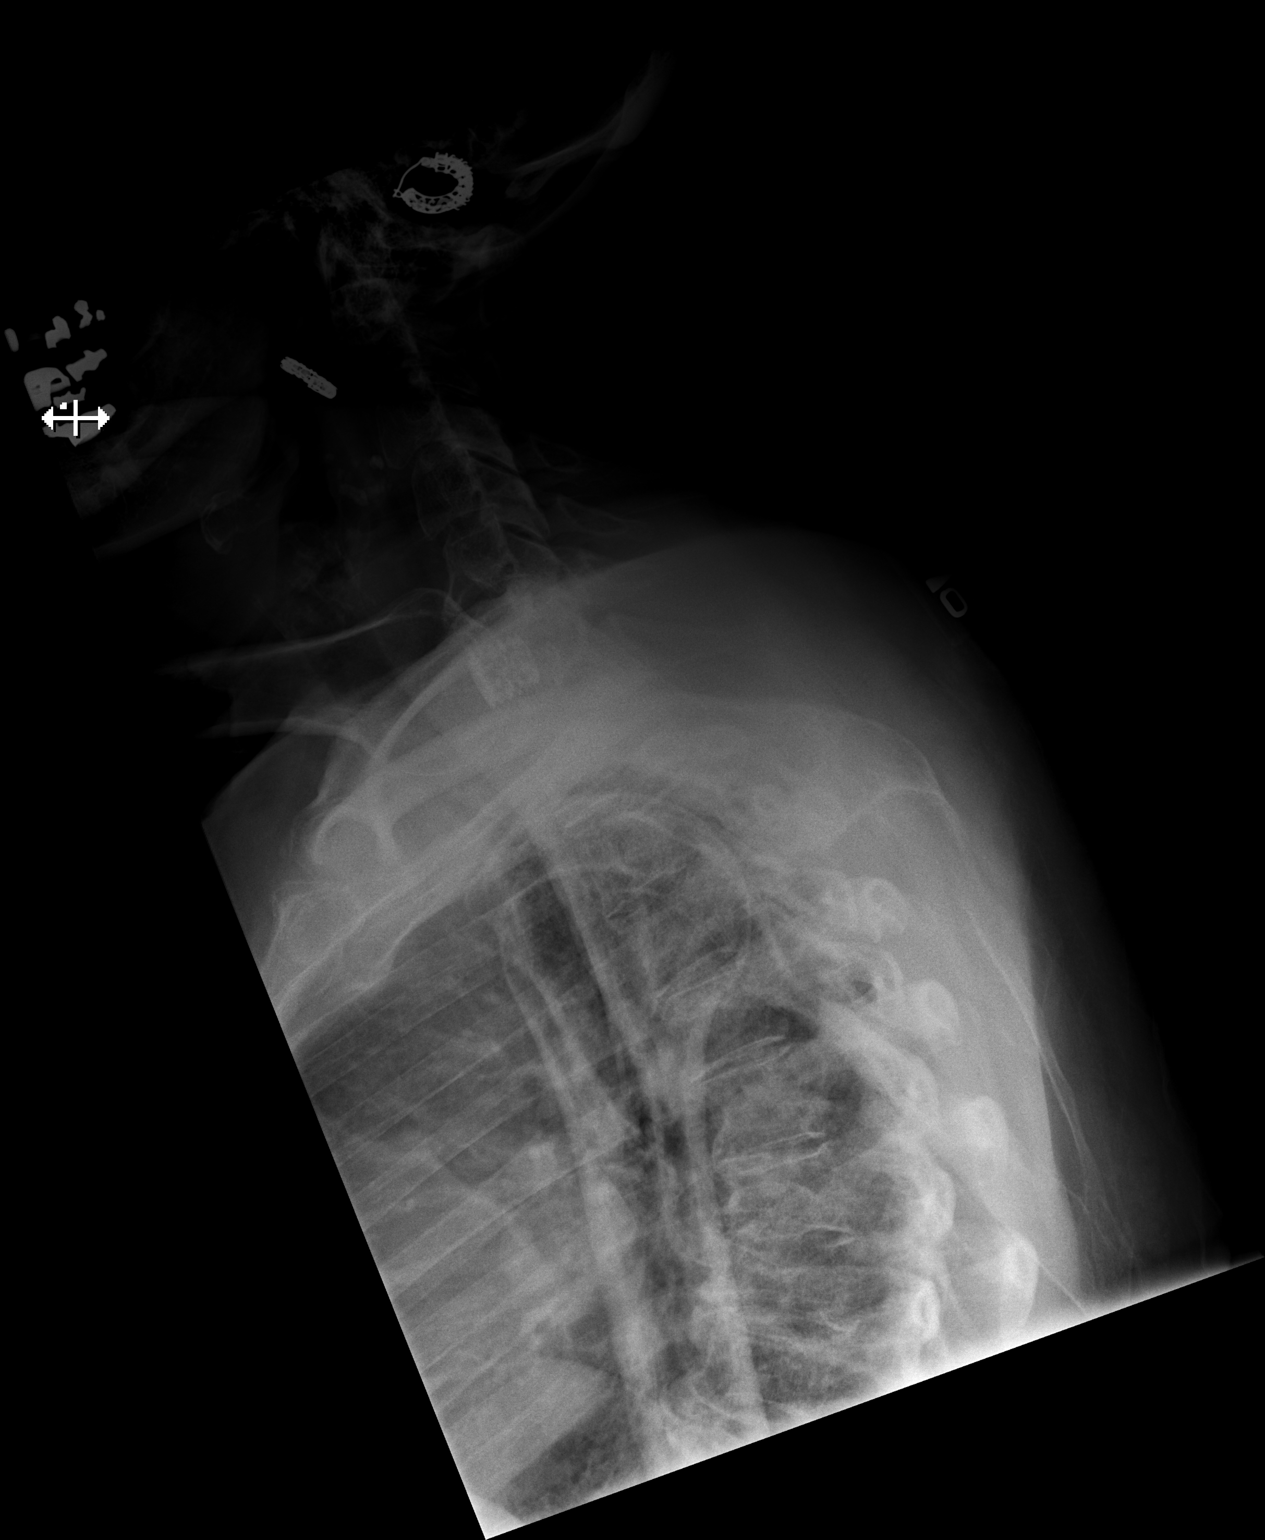

[3 of 3 positions shown; findings below may reference images not displayed]

FINDINGS: Frontal, lateral, and swimmer's views were obtained. There is
significant anterior wedging of the T7 vertebral body which may well
be chronic. There is no other fracture in the thoracic region. No
spondylolisthesis. There is fairly marked disc space narrowing at
C6-7 with moderate disc space narrowing at T7-8. Milder disc space
narrowing is noted at several other levels. There are multiple
anterior and right lateral osteophytes throughout the mid and lower
thoracic region. There is no erosive change or paraspinous lesion.
There is upper thoracic levoscoliosis.
IMPRESSION: Age uncertain but probable chronic fracture T7 vertebral body.
Scoliosis is noted in the upper thoracic region. There is marked
disc space narrowing at T6-7 with mild increase kyphosis in this
area. No spondylolisthesis. Multilevel osteoarthritic change with
multiple anterior and right-sided osteophytes throughout the lower
thoracic region.

## 2017-09-24 ENCOUNTER — Encounter: Payer: Self-pay | Admitting: Neurology

## 2017-09-24 ENCOUNTER — Encounter (INDEPENDENT_AMBULATORY_CARE_PROVIDER_SITE_OTHER): Payer: Self-pay

## 2017-09-24 ENCOUNTER — Ambulatory Visit (INDEPENDENT_AMBULATORY_CARE_PROVIDER_SITE_OTHER): Payer: Medicare Other | Admitting: Neurology

## 2017-09-24 VITALS — BP 151/106 | HR 112 | Ht 62.0 in | Wt 174.5 lb

## 2017-09-24 DIAGNOSIS — I639 Cerebral infarction, unspecified: Secondary | ICD-10-CM

## 2017-09-24 DIAGNOSIS — G8929 Other chronic pain: Secondary | ICD-10-CM | POA: Diagnosis not present

## 2017-09-24 DIAGNOSIS — M5442 Lumbago with sciatica, left side: Secondary | ICD-10-CM | POA: Diagnosis not present

## 2017-09-24 DIAGNOSIS — M5441 Lumbago with sciatica, right side: Secondary | ICD-10-CM | POA: Diagnosis not present

## 2017-09-24 DIAGNOSIS — M542 Cervicalgia: Secondary | ICD-10-CM

## 2017-09-24 DIAGNOSIS — G4733 Obstructive sleep apnea (adult) (pediatric): Secondary | ICD-10-CM

## 2017-09-24 MED ORDER — PREGABALIN 100 MG PO CAPS: ORAL_CAPSULE | ORAL | 1 refills | Status: DC

## 2017-09-24 MED ORDER — PREGABALIN 100 MG PO CAPS: 100.0000 mg | ORAL_CAPSULE | Freq: Two times a day (BID) | ORAL | 1 refills | Status: DC

## 2017-09-24 MED ORDER — NUCYNTA 75 MG PO TABS: ORAL_TABLET | ORAL | 0 refills | Status: DC

## 2017-09-24 NOTE — Progress Notes (Signed)
[facility]  PATIENT: Stacey Jennings DOB: [birth year]  REFERRING PCP:  [name] (fax [phone])  _________________________________   HISTORICAL  CHIEF COMPLAINT:  Chief Complaint  Patient presents with  . Follow-up    Patient reports that her pain has become more severe.     HISTORY OF PRESENT ILLNESS:  Stacey Jennings is a 66 year old woman with chronic pain,  h/o cardio-embolic stroke, reduced memory, OSA.     She was hospitalized in August and fell 08/21/16.    Upgrade 09/24/2017:   She is noting more right leg pain.   Pain is worse with standing and walking and is reduced with sitting.   She takes Nucynta 75 mg 4 times a day (sometimes 150 mg at night).  She takes Lyrica 200 mg nightl and we discussed trying to get a morning dose in to help pain more.     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 She has not wanted to be referred to surgery. Multiple epidural steroid injections in the past some benefit from the first one but less benefit from the others.  She has severe OSA and uses CPAP every night, averaging about 4 hours and we discussed using it the entire night.   She uses a FFM as she opens her mouth at night.  Sleepiness improved with CPAP.  She had a cardio-embolic stroke in 2016 and remains on Plavix and ASA.   She had a TEE showing an ASD.   She tolerates her medications well. Dr. Beverely Pace (cardiology) is providing refills.  From 03/31/2017:  She is generally feeling better.  She had a 1 day admission for confusion 2 months ago.     She stopped diazepam at night and is using OTC Unisom.  No more spells of confusion.    She feels more alert.   She has started driving again.     CVA:   She had a cardioembolic stroke May 2016 with confusion.  She has had no other stroke like symptoms or TIA.  A TEE was done apparently showing an ASD.     We never got the MRI's or all her records.   She is on Plavix  plus ASA.    Neck pain and HA:   Neck pain is doing better.   Headache is better.   When it flares up, she takes Nucynta.  Lyrica has helped,.  Back pain and right leg > left leg pain:  She reports LBP that radiates down the legs, right > left.     She had acupuncture and felt bette while the needle was placed but worse again after the needle was removed.     No new weakness.   She is on Lyrica and prn Nucynta (usually 2/day)  Memory/mood:   She feels memory is doing better off the valium.  She feels her depression is better.  OSA: She has severe OSA with AHI equals 82.3. She was titrated to CPAP +13.  She is compliant.     She is urinating less at night but has not noted any other improvement.      Studies:   MRI of the cervical spine dated 10/04/2014 shows prior fusion at C6-C7 with some posterior bony prominence and left uncovertebral spurring causing mild left foraminal narrowing but no nerve root impingement at this level. A small focus of hyperintensity within the spinal cord adjacent to this level.  At C5-C6, there  is mild spinal stenosis due to broad disc protrusion and facet hypertrophy there is mild foraminal narrowing. There is no myelopathic spinal cord changes. 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 There is prior surgical fusion at L4-S1. There is severe bilateral facet hypertrophy at L2-L3 and L3-L4.   MRI of the brain 03/15/2015, before her stroke, showed 2 small lacunar infarctions in the left frontal lobe and some scattered chronic microvascular ischemic changes.    [place] from [place]  [name].    REVIEW OF SYSTEMS: Constitutional: No fevers, chills, sweats, or change in appetite.  Notes fatigue Eyes: No visual changes, double vision, eye pain Ear, nose and throat: No hearing loss, ear pain, nasal congestion,  sore throat Cardiovascular: No chest pain, palpitations Respiratory: No shortness of breath at rest or with exertion.   No wheezes GastrointestinaI: No nausea, vomiting, diarrhea, abdominal pain, fecal incontinence Genitourinary: No dysuria, urinary retention or frequency.  No nocturia. Musculoskeletal: as above Integumentary: No rash, pruritus, skin lesions Neurological: as above Psychiatric: No current depression at this time.  No anxiety Endocrine: She has DM.   No palpitations, diaphoresis, change in appetite, change in weight or increased thirst Hematologic/Lymphatic: No anemia, purpura, petechiae. Allergic/Immunologic: No itchy/runny eyes, nasal congestion, recent allergic reactions, rashes  ALLERGIES: Allergies  Allergen Reactions  . Codeine Other (See Comments)    hypersomnia  . Cyclobenzaprine     Other reaction(s): Other (See Comments)  . Fentanyl Other (See Comments)    Hypersomnia  . Haldol [Haloperidol Lactate] Other (See Comments)    hallucinations    HOME MEDICATIONS:  Current Outpatient Medications:  .  amLODipine (NORVASC) 5 MG tablet, Take 5 mg by mouth daily., Disp: , Rfl:  .  clopidogrel (PLAVIX) 75 MG tablet, Take 75 mg by mouth daily., Disp: , Rfl:  .  doxylamine, Sleep, (UNISOM) 25 MG tablet, Take 25 mg by mouth at bedtime as needed., Disp: , Rfl:  .  famotidine (PEPCID) 40 MG tablet, Take 40 mg by mouth daily., Disp: , Rfl:  .  insulin detemir (LEVEMIR) 100 UNIT/ML injection, Inject 50 Units 2 (two) times daily into the skin., Disp: , Rfl:  .  levothyroxine (SYNTHROID, LEVOTHROID) 125 MCG tablet, daily., Disp: , Rfl:  .  metoprolol (TOPROL-XL) 200 MG 24 hr tablet, Take 200 mg by mouth daily., Disp: , Rfl:  .  mirabegron ER (MYRBETRIQ) 25 MG TB24 tablet, Take by mouth., Disp: , Rfl:  .  NUCYNTA 75 MG tablet, Take one tablet 4 times daily as needed for pain.  Pharmacy may dispense one 30 day supply with 2 additional refills, or one 90 day supply., Disp:  360 tablet, Rfl: 0 .  pregabalin (LYRICA) 100 MG capsule, Take 1 pill in the morning and 2 at night., Disp: 270 capsule, Rfl: 1 .  rosuvastatin (CRESTOR) 10 MG tablet, Take 10 mg daily by mouth., Disp: , Rfl:  .  metFORMIN (GLUCOPHAGE) 500 MG tablet, Take 500 mg by mouth daily., Disp: , Rfl:   PAST MEDICAL HISTORY: Past Medical History:  Diagnosis Date  . Diabetes mellitus without complication (HCC)   . Headache   . Hypertension   . Vision abnormalities     PAST SURGICAL HISTORY: Past Surgical History:  Procedure Laterality Date  . VESICOVAGINAL FISTULA CLOSURE W/ TAH  FAMILY HISTORY: Family History  Problem Relation Age of Onset  . Breast cancer Mother   . Hypertension Mother   . Huntington's disease Father   . Huntington's disease Sister     SOCIAL HISTORY:  Social History   Socioeconomic History  . Marital status: Married    Spouse name: Not on file  . Number of children: Not on file  . Years of education: Not on file  . Highest education level: Not on file  Social Needs  . Financial resource strain: Not on file  . Food insecurity - worry: Not on file  . Food insecurity - inability: Not on file  . Transportation needs - medical: Not on file  . Transportation needs - non-medical: Not on file  Occupational History  . Not on file  Tobacco Use  . Smoking status: Never Smoker  . Smokeless tobacco: Never Used  Substance and Sexual Activity  . Alcohol use: No    Alcohol/week: 0.0 oz  . Drug use: No  . Sexual activity: Not on file  Other Topics Concern  . Not on file  Social History Narrative  . Not on file     PHYSICAL EXAM  Vitals:   09/24/17 1128  BP: (!) 151/106  Pulse: (!) 112  Weight: 174 lb 8 oz (79.2 kg)  Height: 5\' 2"  (1.575 m)    Body mass index is 31.92 kg/m.   General: The patient is well-developed and well-nourished and in no acute distress.  Musculoskeletal:   She is tender over the Piriformis muscles and trochanteric  bursae.   Neurologic Exam  Mental status: The patient is alert and oriented x 3 at the time of the examination. The patient has improved, normal recent memory with normal attention span .     Speech is normal.  Cranial nerves: Extraocular movements are full.  No neglect or extinction.   Facial strength and sensation is normal. Trapezius and sternocleidomastoid strength is normal. No dysarthria is noted. No obvious hearing deficits are noted.  Motor:  Muscle bulk is normal.   Tone is normal. Strength is  5 / 5 in all 4 extremities.   Sensory: She has normal temperature and touch and vibration sensation in the arms. She has reduced vibration sensation at the great toes bilaterally. She has reduced sensation in the right foot involving the S1 dermatome more than the L5 dermatome, relative to the left.  Coordination: Cerebellar testing reveals good finger-nose-finger and ok heel-to-shin bilaterally.  Gait and station: Station is normal.   Her gait is mildly wide. Tandem gait is poor. Romberg is borderline..   Reflexes: Deep tendon reflexes are symmetric and normal bilaterally in arms and absent in both legs.      DIAGNOSTIC DATA (LABS, IMAGING, TESTING) - I reviewed patient records, labs, notes, testing and imaging myself where available.    ASSESSMENT AND PLAN  Chronic bilateral low back pain with bilateral sciatica  Neck pain  Obstructive sleep apnea  Cardioembolic stroke (HCC)     1.   Change Lyrica to 100 mg po qPM and 100 mg po qHS.    She can take Nucynta 75 mg,up to 2-3 a day. Nucynta chosen over opiate to reduce lethargy and because of OSA.    2.   Continue Plavix and ASA for stroke prophylaxis 3.    Continue to try to lose weight. 4.   rtc 4-5 months or sooner if there are new or worsening neurologic symptoms.   [place], she can cancel this appointment.  Richard A. Epimenio FootSater, MD, PhD 09/24/2017, 12:54 PM Certified in  Neurology, Clinical Neurophysiology, Sleep Medicine, Pain Medicine and Neuroimaging  [facility] [address], [address] [place], [place] [address] [phone]

## 2017-10-07 DIAGNOSIS — L03119 Cellulitis of unspecified part of limb: Secondary | ICD-10-CM | POA: Insufficient documentation

## 2017-10-10 ENCOUNTER — Telehealth: Payer: Self-pay | Admitting: Neurology

## 2017-10-10 NOTE — Telephone Encounter (Signed)
Received fax from [facility].  They needed pt's dx and tried and failed meds before releasing Nucynta rx.  Pt. takes Nucynta for chronic lbp with bilat sciatica, and neck pain. She has tried and failed NSAIDS, Tylenol, Lyrica alone (takes Lyrica, but it did not provide sufficient pain relief by itself).  RAS rx's Nucynta above other opiates in order to avoid further lethargy (she has OSA).  I have faxed this info back to [facility]/fim

## 2017-10-10 NOTE — Telephone Encounter (Signed)
Pt called said [facility] will not release NUCYNTA 75 MG tablet,. She said there are new guidelines and have sent a fax to the clinic and will not release the medication until it is rec'd back. Please call [facility] at MD line [phone],  order# [id number] for VO.

## 2017-10-27 ENCOUNTER — Telehealth: Payer: Self-pay | Admitting: Neurology

## 2017-10-27 NOTE — Telephone Encounter (Signed)
Pt called she is experiencing pain in the legs, neck and arms. Nuycenta 4 x day and lyrica bid helps some. Please call to advise

## 2017-10-27 NOTE — Telephone Encounter (Signed)
error 

## 2017-10-28 NOTE — Telephone Encounter (Signed)
LMTC (identified vm)/fim 

## 2017-11-17 NOTE — Telephone Encounter (Signed)
LMTC (mobile #, as requested)/fim

## 2017-11-17 NOTE — Telephone Encounter (Signed)
Pt has returned the call to RN Faith, she is asking for a returned call on her mobile

## 2017-11-17 NOTE — Telephone Encounter (Signed)
Pt has returned the call to RN Rushie GoltzFaith and is asking for a call back to her mobile of [phone]

## 2017-11-18 NOTE — Telephone Encounter (Signed)
Spoke with pt. She sts. 2 wks. ago, she had 2 falls.  No serious injury.  Since then, is having more neck, right shoulder/arm pain, lbp, right leg pain with certain movements.  Pain manageable with her normal meds but would like RAS to eval.  His sched. is quite full at this time, so I have given ov appt., added her to the cancellation list I keep for RAS pt's, and encouraged her to see pcp if needed to eval new fall, pain.  She verbalized understanding of same/fim

## 2018-01-20 ENCOUNTER — Ambulatory Visit (INDEPENDENT_AMBULATORY_CARE_PROVIDER_SITE_OTHER): Payer: Medicare Other | Admitting: Neurology

## 2018-01-20 ENCOUNTER — Telehealth: Payer: Self-pay | Admitting: Neurology

## 2018-01-20 ENCOUNTER — Encounter: Payer: Self-pay | Admitting: Neurology

## 2018-01-20 VITALS — BP 109/65 | HR 63 | Resp 18 | Ht 62.0 in | Wt 187.0 lb

## 2018-01-20 DIAGNOSIS — M5417 Radiculopathy, lumbosacral region: Secondary | ICD-10-CM | POA: Diagnosis not present

## 2018-01-20 DIAGNOSIS — Z981 Arthrodesis status: Secondary | ICD-10-CM | POA: Diagnosis not present

## 2018-01-20 DIAGNOSIS — M542 Cervicalgia: Secondary | ICD-10-CM

## 2018-01-20 DIAGNOSIS — M5412 Radiculopathy, cervical region: Secondary | ICD-10-CM | POA: Insufficient documentation

## 2018-01-20 DIAGNOSIS — G8929 Other chronic pain: Secondary | ICD-10-CM

## 2018-01-20 DIAGNOSIS — M5441 Lumbago with sciatica, right side: Secondary | ICD-10-CM

## 2018-01-20 MED ORDER — NUCYNTA 75 MG PO TABS: ORAL_TABLET | ORAL | 0 refills | Status: DC

## 2018-01-20 MED ORDER — PREGABALIN 100 MG PO CAPS: ORAL_CAPSULE | ORAL | 1 refills | Status: AC

## 2018-01-20 NOTE — Progress Notes (Signed)
[facility]  PATIENT: Stacey Jennings DOB: 06/10/1951  REFERRING PCP:  Micael HampshireKathy Brown (fax [phone])  _________________________________   HISTORICAL  CHIEF COMPLAINT:  Chief Complaint  Patient presents with  . Hx. of CVA    Denies new stroke sx. and reports compliance with ASA 81mg  and Plavix 75mg .  She also reports compliance with CPAP.  Sts. neck pain, right shoulder pain to right elbow, lbp, right leg pain are worse since last ov.fim  . Neck Pain  . Back Pain  . Sleep Apnea    HISTORY OF PRESENT ILLNESS:  Stacey RobertMargaret Godino is a 67 year old woman with chronic pain,  h/o cardio-embolic stroke, reduced memory, OSA.     She was hospitalized in August and fell 08/21/16.      They moved to [place].    Update 01/20/2018:   She reports more LBP and right leg pain into the side and bottom of the foot..   She had a couple ESI's without benefit.    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    She also has more neck pain and pain that radiates down the right arm to the elbow.    She has had C6C7 fusion and has mild spinal stenosis on her 2015 MRI  Currently, she is on Nucynta 75 mg by mouth 4 times a day and Lyrica 100 mg in the morning and 200 mg at night.   She has not escalated   She has severe OSA and uses CPAP every night, averaging about 4 hours and we discussed using it the entire night.   She uses a FFM as she opens her mouth at night.  Sleepiness improved with CPAP.  Update 09/24/2017:   She is noting more right leg pain.   Pain is worse with standing and walking and is reduced with sitting.   She takes Nucynta 75 mg 4 times a day (sometimes 150 mg at night).  She takes Lyrica 200 mg nightl and we discussed trying to get a morning dose in to help pain more.     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 She has not wanted to be referred to surgery. Multiple epidural steroid injections in  the past some benefit from the first one but less benefit from the others.  She has severe OSA and uses CPAP every night, averaging about 4 hours and we discussed using it the entire night.   She uses a FFM as she opens her mouth at night.  Sleepiness improved with CPAP.  She had a cardio-embolic stroke in 2016 and remains on Plavix and ASA.   She had a TEE showing an ASD.   She tolerates her medications well. Dr. Beverely Paceheek (cardiology) is providing refills.  From 03/31/2017:  She is generally feeling better.  She had a 1 day admission for confusion 2 months ago.     She stopped diazepam at night and is using OTC Unisom.  No more spells of confusion.    She feels more alert.   She has started driving again.     CVA:   She had a cardioembolic stroke May 2016 with confusion.  She has had no other stroke like symptoms or TIA.  A TEE was done apparently showing an ASD.     We never got the MRI's or all her records.   She is on Plavix plus ASA.  Neck pain and HA:   Neck pain is doing better.   Headache is better.   When it flares up, she takes Nucynta.  Lyrica has helped,.  Back pain and right leg > left leg pain:  She reports LBP that radiates down the legs, right > left.     She had acupuncture and felt bette while the needle was placed but worse again after the needle was removed.     No new weakness.   She is on Lyrica and prn Nucynta (usually 2/day)  Memory/mood:   She feels memory is doing better off the valium.  She feels her depression is better.  OSA: She has severe OSA with AHI equals 82.3. She was titrated to CPAP +13.  She is compliant.     She is urinating less at night but has not noted any other improvement.      Studies:   MRI of the cervical spine dated 10/04/2014 shows prior fusion at C6-C7 with some posterior bony prominence and left uncovertebral spurring causing mild left foraminal narrowing but no nerve root impingement at this level. A small focus of hyperintensity within the  spinal cord adjacent to this level.  At C5-C6, there is mild spinal stenosis due to broad disc protrusion and facet hypertrophy there is mild foraminal narrowing. There is no myelopathic spinal cord changes. 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 There is prior surgical fusion at L4-S1. There is severe bilateral facet hypertrophy at L2-L3 and L3-L4.   MRI of the brain 03/15/2015, before her stroke, showed 2 small lacunar infarctions in the left frontal lobe and some scattered chronic microvascular ischemic changes.    We never got the MRI images from [place].  [name]    REVIEW OF SYSTEMS: Constitutional: No fevers, chills, sweats, or change in appetite.  Notes fatigue Eyes: No visual changes, double vision, eye pain Ear, nose and throat: No hearing loss, ear pain, nasal congestion, sore throat Cardiovascular: No chest pain, palpitations Respiratory: No shortness of breath at rest or with exertion.   No wheezes GastrointestinaI: No nausea, vomiting, diarrhea, abdominal pain, fecal incontinence Genitourinary: No dysuria, urinary retention or frequency.  No nocturia. Musculoskeletal: as above Integumentary: No rash, pruritus, skin lesions Neurological: as above Psychiatric: No current depression at this time.  No anxiety Endocrine: She has DM.   No palpitations, diaphoresis, change in appetite, change in weight or increased thirst Hematologic/Lymphatic: No anemia, purpura, petechiae. Allergic/Immunologic: No itchy/runny eyes, nasal congestion, recent allergic reactions, rashes  ALLERGIES: Allergies  Allergen Reactions  . Codeine Other (See Comments)    hypersomnia  . Cyclobenzaprine     Other reaction(s): Other (See Comments)  . Fentanyl Other (See Comments)    Hypersomnia  . Haldol [Haloperidol Lactate] Other (See Comments)     hallucinations    HOME MEDICATIONS:  Current Outpatient Medications:  .  amLODipine (NORVASC) 5 MG tablet, Take 5 mg by mouth daily., Disp: , Rfl:  .  clopidogrel (PLAVIX) 75 MG tablet, Take 75 mg by mouth daily., Disp: , Rfl:  .  famotidine (PEPCID) 40 MG tablet, Take 40 mg by mouth daily., Disp: , Rfl:  .  insulin detemir (LEVEMIR) 100 UNIT/ML injection, Inject 50 Units 2 (two) times daily into the skin., Disp: , Rfl:  .  levothyroxine (SYNTHROID, LEVOTHROID) 125 MCG tablet, daily., Disp: ,  Rfl:  .  Melatonin 1 MG TABS, Take by mouth., Disp: , Rfl:  .  metoprolol succinate (TOPROL-XL) 100 MG 24 hr tablet, , Disp: , Rfl:  .  mirabegron ER (MYRBETRIQ) 25 MG TB24 tablet, Take by mouth., Disp: , Rfl:  .  NUCYNTA 75 MG tablet, Take one tablet 4 times daily as needed for pain.  Pharmacy may dispense one 30 day supply with 2 additional refills, or one 90 day supply., Disp: 360 tablet, Rfl: 0 .  pregabalin (LYRICA) 100 MG capsule, Take 1 pill in the morning and 2 at night., Disp: 270 capsule, Rfl: 1 .  rosuvastatin (CRESTOR) 10 MG tablet, Take 10 mg daily by mouth., Disp: , Rfl:  .  doxylamine, Sleep, (UNISOM) 25 MG tablet, Take 25 mg by mouth at bedtime as needed., Disp: , Rfl:  .  metFORMIN (GLUCOPHAGE) 500 MG tablet, Take 500 mg by mouth daily., Disp: , Rfl:   PAST MEDICAL HISTORY: Past Medical History:  Diagnosis Date  . Diabetes mellitus without complication (HCC)   . Headache   . Hypertension   . Vision abnormalities     PAST SURGICAL HISTORY: Past Surgical History:  Procedure Laterality Date  . VESICOVAGINAL FISTULA CLOSURE W/ TAH      FAMILY HISTORY: Family History  Problem Relation Age of Onset  . Breast cancer Mother   . Hypertension Mother   . Huntington's disease Father   . Huntington's disease Sister     SOCIAL HISTORY:  Social History   Socioeconomic History  . Marital status: Married    Spouse name: Not on file  . Number of children: Not on file  . Years  of education: Not on file  . Highest education level: Not on file  Social Needs  . Financial resource strain: Not on file  . Food insecurity - worry: Not on file  . Food insecurity - inability: Not on file  . Transportation needs - medical: Not on file  . Transportation needs - non-medical: Not on file  Occupational History  . Not on file  Tobacco Use  . Smoking status: Never Smoker  . Smokeless tobacco: Never Used  Substance and Sexual Activity  . Alcohol use: No    Alcohol/week: 0.0 oz  . Drug use: No  . Sexual activity: Not on file  Other Topics Concern  . Not on file  Social History Narrative  . Not on file     PHYSICAL EXAM  Vitals:   01/20/18 1125  BP: 109/65  Pulse: 63  Resp: 18  Weight: 187 lb (84.8 kg)  Height: 5\' 2"  (1.575 m)    Body mass index is 34.2 kg/m.   General: The patient is well-developed and well-nourished and in no acute distress.  Musculoskeletal:   She is tender over the Piriformis muscles and trochanteric bursae.   Neurologic Exam  Mental status: The patient is alert and oriented x 3 at the time of the examination. The patient has improved, normal recent memory with normal attention span .     Speech is normal.  Cranial nerves: Extraocular movements are full.  No neglect or extinction.   Facial strength and sensation is normal. Trapezius and sternocleidomastoid strength is normal. No dysarthria is noted. No obvious hearing deficits are noted.  Motor:  Muscle bulk is normal.   Tone is normal. Strength is  5 / 5 in all 4 extremities.   Sensory: She has normal temperature and touch and vibration sensation in the arms. She  has reduced vibration sensation at the great toes bilaterally. She has reduced sensation in the right foot involving the S1 dermatome more than the L5 dermatome, relative to the left.  Coordination: Cerebellar testing reveals good finger-nose-finger and ok heel-to-shin bilaterally.  Gait and station: Station is normal.    Her gait is mildly wide. Tandem gait is poor. Romberg is borderline..   Reflexes: Deep tendon reflexes are symmetric and normal bilaterally in arms and absent in both legs.      DIAGNOSTIC DATA (LABS, IMAGING, TESTING) - I reviewed patient records, labs, notes, testing and imaging myself where available.    ASSESSMENT AND PLAN  Chronic bilateral low back pain with right-sided sciatica - Plan: MR Lumbar Spine W Wo Contrast  Cervicalgia - Plan: MR CERVICAL SPINE WO CONTRAST  Lumbosacral radiculopathy at S1 - Plan: MR Lumbar Spine W Wo Contrast  Cervical radiculopathy  History of lumbar fusion - Plan: MR Lumbar Spine W Wo Contrast  History of fusion of cervical spine    1.   Change Lyrica to 150 mg po qPM and 300  mg po qHS.    Nucynta 75 mg,up to 4 a day. We discussed that Nucynta chosen over opiate to reduce lethargy and because of OSA.   The[place] controlled substances database was reviewed and she is not taking more than prescribed or getting medicines from other doctors. 2.   Continue Plavix and ASA for stroke prophylaxis 3.   MRI of the lumbar and cervical spine due to worsening radiculopathies. She has moved out of this area and we may need to consider referring her to a surgeon closer to her home based on the results. 4.   Continue to try to lose weight. 5.   rtc 4-5 months or sooner if there are new or worsening neurologic symptoms.   If she changes doctors to the [place] area as they are planning, she can cancel this appointment.  Aivah Putman A. Epimenio Foot, MD, PhD 01/20/2018, 11:37 AM Certified in Neurology, Clinical Neurophysiology, Sleep Medicine, Pain Medicine and Neuroimaging  [facility] [address], [address] [place], [place] [address] [phone]

## 2018-01-20 NOTE — Telephone Encounter (Signed)
01/20/18 [employer] Medicare order sent to [facility]

## 2018-01-22 ENCOUNTER — Ambulatory Visit: Payer: Medicare Other | Admitting: Neurology

## 2018-01-29 ENCOUNTER — Ambulatory Visit
Admission: RE | Admit: 2018-01-29 | Discharge: 2018-01-29 | Disposition: A | Discharge status: Home / Self Care | Payer: Medicare Other | Source: Ambulatory Visit | Attending: Neurology | Admitting: Neurology

## 2018-01-29 DIAGNOSIS — M5417 Radiculopathy, lumbosacral region: Secondary | ICD-10-CM | POA: Diagnosis not present

## 2018-01-29 DIAGNOSIS — Z981 Arthrodesis status: Secondary | ICD-10-CM

## 2018-01-29 DIAGNOSIS — G8929 Other chronic pain: Secondary | ICD-10-CM

## 2018-01-29 DIAGNOSIS — R202 Paresthesia of skin: Secondary | ICD-10-CM | POA: Diagnosis not present

## 2018-01-29 DIAGNOSIS — M542 Cervicalgia: Secondary | ICD-10-CM

## 2018-01-29 DIAGNOSIS — M5441 Lumbago with sciatica, right side: Secondary | ICD-10-CM | POA: Diagnosis not present

## 2018-01-29 MED ORDER — GADOBENATE DIMEGLUMINE 529 MG/ML IV SOLN
17.0000 mL | Freq: Once | INTRAVENOUS | Status: AC | PRN
  Administered 2018-01-29: 17 mL via INTRAVENOUS

## 2018-02-04 ENCOUNTER — Telehealth: Payer: Self-pay | Admitting: *Deleted

## 2018-02-04 DIAGNOSIS — M79604 Pain in right leg: Secondary | ICD-10-CM

## 2018-02-04 DIAGNOSIS — M48061 Spinal stenosis, lumbar region without neurogenic claudication: Secondary | ICD-10-CM

## 2018-02-04 NOTE — Telephone Encounter (Signed)
-----   Message from Asa Lenteichard A Sater, MD sent at 02/02/2018  5:47 PM EDT ----- Please let her know that the MRI of the cervical spine shows mild spinal stenosis at C4-C5 and C5-C6 but there does not appear to be nerve root compression and no pressure on the spinal cord.    In the lumbar spine, she does have moderate spinal stenosis at L3-L4 and there appear to be left L4 nerve root compression.  Some of the other levels also show degenerative changes and at the one level where she had surgery (L5-S1) the could be a little pressure on the right S1 nerve root.   If her back pain is not any better we can refer for   epidural steroid injection.  [place] and we could send him information to some place there.

## 2018-02-04 NOTE — Telephone Encounter (Signed)
Spoke with Stacey Jennings and reviewed results of MRI brain and cervical spine, offered esi if pain has persisted.  She verbalized understanding of same, sts. would like to hold off on esi for the time being; will let us know if she feels she needs it/fim

## 2018-02-12 NOTE — Addendum Note (Signed)
Addended by: Candis SchatzMISENHEIMER, Terran Klinke I on: 02/12/2018 03:02 PM   Modules accepted: Orders

## 2018-02-12 NOTE — Telephone Encounter (Signed)
Pt has called now to advise that she would now like to proceed with scheduling the Epidural.  Pt is asking for a call to discuss

## 2018-02-12 NOTE — Telephone Encounter (Signed)
Spoke with Stacey Jennings. She sts. lb, right leg pain is some worse, would like to proceed with esi.  Per RAS, ok for right L5 esi.  Order placed in epic/fim

## 2018-02-24 NOTE — Telephone Encounter (Signed)
Noted/fim 

## 2018-02-24 NOTE — Telephone Encounter (Signed)
Pt calling to give correct information to RN Faith re: who prescribed the Plavix.  Pt states it was Dr Talitha Givensathy Brown @ [phone] who wrote the last Rx for the plavix.  Pt has not requested a call back

## 2018-02-25 ENCOUNTER — Encounter: Payer: Self-pay | Admitting: Neurology

## 2018-03-05 ENCOUNTER — Other Ambulatory Visit: Payer: Self-pay | Admitting: Neurology

## 2018-03-05 ENCOUNTER — Ambulatory Visit
Admission: RE | Admit: 2018-03-05 | Discharge: 2018-03-05 | Disposition: A | Discharge status: Home / Self Care | Payer: Medicare Other | Source: Ambulatory Visit | Attending: Neurology | Admitting: Neurology

## 2018-03-05 DIAGNOSIS — M79604 Pain in right leg: Secondary | ICD-10-CM

## 2018-03-05 DIAGNOSIS — M48061 Spinal stenosis, lumbar region without neurogenic claudication: Secondary | ICD-10-CM

## 2018-03-05 MED ORDER — METHYLPREDNISOLONE ACETATE 40 MG/ML INJ SUSP (RADIOLOG
120.0000 mg | Freq: Once | INTRAMUSCULAR | Status: AC
  Administered 2018-03-05: 120 mg via EPIDURAL

## 2018-03-05 MED ORDER — IOPAMIDOL (ISOVUE-M 200) INJECTION 41%
1.0000 mL | Freq: Once | INTRAMUSCULAR | Status: AC
  Administered 2018-03-05: 1 mL via EPIDURAL

## 2018-03-05 MED ORDER — DIAZEPAM 5 MG PO TABS
5.0000 mg | ORAL_TABLET | Freq: Once | ORAL | Status: AC
  Administered 2018-03-05: 5 mg via ORAL

## 2018-03-05 NOTE — Discharge Instructions (Signed)

## 2018-03-24 ENCOUNTER — Encounter: Payer: Self-pay | Admitting: *Deleted

## 2018-03-26 ENCOUNTER — Other Ambulatory Visit: Payer: Self-pay | Admitting: Neurology

## 2018-03-26 NOTE — Telephone Encounter (Signed)
Pt called to advise NUCYNTA 75 MG tablet needs PA. She said [facility] is faxing request to [phone]

## 2018-03-27 NOTE — Telephone Encounter (Signed)
I initiated PA for nucynta  po qid on covermymeds Key [id number].

## 2018-03-31 ENCOUNTER — Telehealth: Payer: Self-pay | Admitting: *Deleted

## 2018-03-31 NOTE — Telephone Encounter (Signed)
Fax received from [facility].  PA for Nucynta  tablets is approved for a 7 day supply limit exception thru 11/10/18 under pt's Medicare Part D benefit.  Ref# [id number]

## 2018-05-27 ENCOUNTER — Ambulatory Visit (INDEPENDENT_AMBULATORY_CARE_PROVIDER_SITE_OTHER): Payer: Medicare Other | Admitting: Neurology

## 2018-05-27 ENCOUNTER — Other Ambulatory Visit: Payer: Self-pay

## 2018-05-27 ENCOUNTER — Encounter: Payer: Self-pay | Admitting: Neurology

## 2018-05-27 VITALS — BP 118/64 | HR 76 | Resp 22 | Ht 62.0 in | Wt 171.0 lb

## 2018-05-27 DIAGNOSIS — G4733 Obstructive sleep apnea (adult) (pediatric): Secondary | ICD-10-CM | POA: Diagnosis not present

## 2018-05-27 DIAGNOSIS — M5417 Radiculopathy, lumbosacral region: Secondary | ICD-10-CM | POA: Diagnosis not present

## 2018-05-27 DIAGNOSIS — I639 Cerebral infarction, unspecified: Secondary | ICD-10-CM | POA: Diagnosis not present

## 2018-05-27 DIAGNOSIS — Z981 Arthrodesis status: Secondary | ICD-10-CM | POA: Diagnosis not present

## 2018-05-27 MED ORDER — NUCYNTA 75 MG PO TABS: ORAL_TABLET | ORAL | 0 refills | Status: AC

## 2018-05-27 NOTE — Progress Notes (Signed)
[facility]  PATIENT: Stacey Jennings DOB: 09/07/1951  REFERRING PCP:  Micael HampshireKathy Brown (fax [phone])  _________________________________   HISTORICAL  CHIEF COMPLAINT:  Chief Complaint  Patient presents with  . Hx. of CVA    Denies new stroke sx. and reports compliance with Plavix. Sts. is compliant with CPAP/fim  . Sleep Apnea  . Neck Pain  . Back Pain    HISTORY OF PRESENT ILLNESS:  Stacey RobertMargaret Pardoe is a 67 year old woman with chronic pain,  h/o cardio-embolic stroke, reduced memory, OSA.      Update 05/27/2018: She feels she has been stable since the last visit.    The last ESI seemed to help her a lot x 6 weeks.   Pain is in her back and down her right leg.     Despite L4-S1 fusion, she has right L5S1 HNP .    Nucynta 75 mg has helped.   She takes 1-2 a day, every night and more if she needs to during the day.   She does not escalate and is not receiving opiates elsewhere.   Leg strength is doing about the same.     Her neck pain/arm pain is much better.       She has OSA and uses it nightly, sometimes only part of the night.    She had less sleepiness after starting the therapy.    Update 01/20/2018:   She reports more LBP and right leg pain into the side and bottom of the foot..   She had a couple ESI's without benefit.    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    She also has more neck pain and pain that radiates down the right arm to the elbow.    She has had C6C7 fusion and has mild spinal stenosis on her 2015 MRI  Currently, she is on Nucynta 75 mg by mouth 4 times a day and Lyrica 100 mg in the morning and 200 mg at night.   She has not escalated   She has severe OSA and uses CPAP every night, averaging about 4 hours and we discussed using it the entire night.   She uses a FFM as she opens her mouth at night.  Sleepiness improved with CPAP.  Update 09/24/2017:   She is noting more right leg pain.   Pain is worse with  standing and walking and is reduced with sitting.   She takes Nucynta 75 mg 4 times a day (sometimes 150 mg at night).  She takes Lyrica 200 mg nightl and we discussed trying to get a morning dose in to help pain more.     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 She has not wanted to be referred to surgery. Multiple epidural steroid injections in the past some benefit from the first one but less benefit from the others.  She has severe OSA and uses CPAP every night, averaging about 4 hours and we discussed using it the entire night.   She uses a FFM as she opens her mouth at night.  Sleepiness improved with CPAP.  She had a cardio-embolic stroke in 2016 and remains on Plavix and ASA.   She had a TEE showing an ASD.   She tolerates her medications well. Dr. Beverely Paceheek (cardiology) is providing refills.  From 03/31/2017:  She is generally feeling better.  She had a 1 day  admission for confusion 2 months ago.     She stopped diazepam at night and is using OTC Unisom.  No more spells of confusion.    She feels more alert.   She has started driving again.     CVA:   She had a cardioembolic stroke May 2016 with confusion.  She has had no other stroke like symptoms or TIA.  A TEE was done apparently showing an ASD.     We never got the MRI's or all her records.   She is on Plavix plus ASA.    Neck pain and HA:   Neck pain is doing better.   Headache is better.   When it flares up, she takes Nucynta.  Lyrica has helped,.  Back pain and right leg > left leg pain:  She reports LBP that radiates down the legs, right > left.     She had acupuncture and felt bette while the needle was placed but worse again after the needle was removed.     No new weakness.   She is on Lyrica and prn Nucynta (usually 2/day)  Memory/mood:   She feels memory is doing better off the valium.  She feels her depression is better.  OSA: She has severe OSA with AHI equals 82.3. She was  titrated to CPAP +13.  She is compliant.     She is urinating less at night but has not noted any other improvement.      Studies:   MRI of the cervical spine dated 10/04/2014 shows prior fusion at C6-C7 with some posterior bony prominence and left uncovertebral spurring causing mild left foraminal narrowing but no nerve root impingement at this level. A small focus of hyperintensity within the spinal cord adjacent to this level.  At C5-C6, there is mild spinal stenosis due to broad disc protrusion and facet hypertrophy there is mild foraminal narrowing. There is no myelopathic spinal cord changes. 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 There is prior surgical fusion at L4-S1. There is severe bilateral facet hypertrophy at L2-L3 and L3-L4.   MRI of the brain 03/15/2015, before her stroke, showed 2 small lacunar infarctions in the left frontal lobe and some scattered chronic microvascular ischemic changes.    We never got the MRI images from [place].  [name]    REVIEW OF SYSTEMS: Constitutional: No fevers, chills, sweats, or change in appetite.  Notes fatigue Eyes: No visual changes, double vision, eye pain Ear, nose and throat: No hearing loss, ear pain, nasal congestion, sore throat Cardiovascular: No chest pain, palpitations Respiratory: No shortness of breath at rest or with exertion.   No wheezes GastrointestinaI: No nausea, vomiting, diarrhea, abdominal pain, fecal incontinence Genitourinary: No dysuria, urinary retention or frequency.  No nocturia. Musculoskeletal: as above Integumentary: No rash, pruritus, skin lesions Neurological: as above Psychiatric: No current depression at this time.  No anxiety Endocrine: She has DM.   No palpitations, diaphoresis, change in appetite, change in weight or increased thirst Hematologic/Lymphatic:  No anemia, purpura, petechiae. Allergic/Immunologic: No itchy/runny eyes, nasal congestion, recent allergic reactions, rashes  ALLERGIES: Allergies  Allergen Reactions  . Codeine Other (See Comments)    hypersomnia  . Cyclobenzaprine     Other reaction(s): Other (See Comments)  . Fentanyl Other (See Comments)    Hypersomnia  . Haldol [Haloperidol Lactate]  Other (See Comments)    hallucinations    HOME MEDICATIONS:  Current Outpatient Medications:  .  amLODipine (NORVASC) 5 MG tablet, Take 5 mg by mouth daily., Disp: , Rfl:  .  clopidogrel (PLAVIX) 75 MG tablet, Take 75 mg by mouth daily., Disp: , Rfl:  .  doxylamine, Sleep, (UNISOM) 25 MG tablet, Take 25 mg by mouth at bedtime as needed., Disp: , Rfl:  .  famotidine (PEPCID) 40 MG tablet, Take 40 mg by mouth daily., Disp: , Rfl:  .  insulin detemir (LEVEMIR) 100 UNIT/ML injection, Inject 50 Units 2 (two) times daily into the skin., Disp: , Rfl:  .  levothyroxine (SYNTHROID, LEVOTHROID) 125 MCG tablet, daily., Disp: , Rfl:  .  Melatonin 1 MG TABS, Take by mouth., Disp: , Rfl:  .  metoprolol succinate (TOPROL-XL) 100 MG 24 hr tablet, , Disp: , Rfl:  .  mirabegron ER (MYRBETRIQ) 25 MG TB24 tablet, Take by mouth., Disp: , Rfl:  .  NUCYNTA 75 MG tablet, Take one tablet 4 times daily as needed for pain., Disp: 120 tablet, Rfl: 0 .  pregabalin (LYRICA) 100 MG capsule, Take 1 pill in the morning and 2 at night., Disp: 270 capsule, Rfl: 1 .  rosuvastatin (CRESTOR) 10 MG tablet, Take 10 mg daily by mouth., Disp: , Rfl:  .  metFORMIN (GLUCOPHAGE) 500 MG tablet, Take 500 mg by mouth daily., Disp: , Rfl:   PAST MEDICAL HISTORY: Past Medical History:  Diagnosis Date  . Diabetes mellitus without complication (HCC)   . Headache   . Hypertension   . Vision abnormalities     PAST SURGICAL HISTORY: Past Surgical History:  Procedure Laterality Date  . VESICOVAGINAL FISTULA CLOSURE W/ TAH      FAMILY HISTORY: Family History  Problem  Relation Age of Onset  . Breast cancer Mother   . Hypertension Mother   . Huntington's disease Father   . Huntington's disease Sister     SOCIAL HISTORY:  Social History   Socioeconomic History  . Marital status: Married    Spouse name: Not on file  . Number of children: Not on file  . Years of education: Not on file  . Highest education level: Not on file  Occupational History  . Not on file  Social Needs  . Financial resource strain: Not on file  . Food insecurity:    Worry: Not on file    Inability: Not on file  . Transportation needs:    Medical: Not on file    Non-medical: Not on file  Tobacco Use  . Smoking status: Never Smoker  . Smokeless tobacco: Never Used  Substance and Sexual Activity  . Alcohol use: No    Alcohol/week: 0.0 oz  . Drug use: No  . Sexual activity: Not on file  Lifestyle  . Physical activity:    Days per week: Not on file    Minutes per session: Not on file  . Stress: Not on file  Relationships  . Social connections:    Talks on phone: Not on file    Gets together: Not on file    Attends religious service: Not on file    Active member of club or organization: Not on file    Attends meetings of clubs or organizations: Not on file    Relationship status: Not on file  . Intimate partner violence:    Fear of current or ex partner: Not on file    Emotionally abused: Not on  file    Physically abused: Not on file    Forced sexual activity: Not on file  Other Topics Concern  . Not on file  Social History Narrative  . Not on file     PHYSICAL EXAM  Vitals:   05/27/18 1302  BP: 118/64  Pulse: 76  Resp: (!) 22  Weight: 171 lb (77.6 kg)  Height: 5\' 2"  (1.575 m)    Body mass index is 31.28 kg/m.   General: The patient is well-developed and well-nourished and in no acute distress.  Musculoskeletal:   She is tender over the Piriformis muscles and trochanteric bursae.   Neurologic Exam  Mental status: The patient is alert and  oriented x 3 at the time of the examination. The patient has improved, normal recent memory with normal attention span .     Speech is normal.  Cranial nerves: Extraocular movements are full.  Facial strength and sensation is normal.  Trapezius strength is normal.. No dysarthria is noted. No obvious hearing deficits are noted.  Motor:  Muscle bulk is normal.   Tone is normal. Strength is  5 / 5 in all 4 extremities.   Sensory: There is reduced vibration sensation in the toes.  There is also reduce touch sensation in the right S1 distribution  Coordination: Cerebellar testing shows good finger-nose-finger and heel-to-shin..  Gait and station: Station is normal.   Gait is mildly wide.  Tandem gait is moderately wide.  Romberg is borderline..   Reflexes: Deep tendon reflexes are symmetric and normal bilaterally in arms and absent in both legs.      DIAGNOSTIC DATA (LABS, IMAGING, TESTING) - I reviewed patient records, labs, notes, testing and imaging myself where available.    ASSESSMENT AND PLAN  Cardioembolic stroke (HCC)  Obstructive sleep apnea  Lumbosacral radiculopathy at S1  History of lumbar fusion   1.   Continue Lyrica and Nucynta.  Prescription for Nucynta was provided.  The [place] controlled substance database has been reviewed and she is not getting controlled substances elsewhere.  She has moved to the [place] area and they will be looking to find a neurologist closer to home.   2.   Continue Plavix and ASA for stroke prophylaxis 3.   If back pain accept she can have another epidural steroid  4.  I put in an order for return to see me in 4 months.  However, since she has moved to [facility] they will be trying to find a doctor closer to home.  I am more than happy to see her back if she desires.  She knows she can only get controlled substances filled in one location.  If any issues before she find another doctor she should give [place] a call.  Bryley Kovacevic A. Epimenio Foot, MD,  PhD 05/27/2018, 1:35 PM Certified in Neurology, Clinical Neurophysiology, Sleep Medicine, Pain Medicine and Neuroimaging  [facility] [address], [address] [place], [place] [address] [phone]
